# Patient Record
Sex: Male | Born: 1960 | Race: White | Hispanic: No | Marital: Married | Smoking: Never smoker
Health system: Southern US, Community
[De-identification: ages and names within clinical notes are randomized; demographics above are authoritative.]

## PROBLEM LIST (undated history)

## (undated) DIAGNOSIS — M1990 Unspecified osteoarthritis, unspecified site: Secondary | ICD-10-CM

## (undated) DIAGNOSIS — E785 Hyperlipidemia, unspecified: Secondary | ICD-10-CM

## (undated) DIAGNOSIS — M25819 Other specified joint disorders, unspecified shoulder: Secondary | ICD-10-CM

## (undated) DIAGNOSIS — M7540 Impingement syndrome of unspecified shoulder: Secondary | ICD-10-CM

## (undated) DIAGNOSIS — C801 Malignant (primary) neoplasm, unspecified: Secondary | ICD-10-CM

## (undated) HISTORY — PX: HERNIA REPAIR: SHX51

## (undated) HISTORY — PX: SKIN CANCER EXCISION: SHX779

## (undated) HISTORY — PX: SHOULDER SURGERY: SHX246

## (undated) HISTORY — PX: NOSE SURGERY: SHX723

## (undated) HISTORY — PX: KNEE ARTHROSCOPY: SHX127

## (undated) HISTORY — DX: Malignant (primary) neoplasm, unspecified: C80.1

---

## 1999-09-02 ENCOUNTER — Ambulatory Visit (HOSPITAL_COMMUNITY): Admission: RE | Admit: 1999-09-02 | Discharge: 1999-09-02 | Payer: Self-pay | Admitting: General Surgery

## 2002-07-28 ENCOUNTER — Ambulatory Visit (HOSPITAL_BASED_OUTPATIENT_CLINIC_OR_DEPARTMENT_OTHER): Admission: RE | Admit: 2002-07-28 | Discharge: 2002-07-28 | Payer: Self-pay | Admitting: Orthopedic Surgery

## 2006-07-12 ENCOUNTER — Emergency Department (HOSPITAL_COMMUNITY): Admission: EM | Admit: 2006-07-12 | Discharge: 2006-07-12 | Payer: Self-pay | Admitting: Emergency Medicine

## 2015-08-06 ENCOUNTER — Other Ambulatory Visit: Payer: Self-pay | Admitting: Physician Assistant

## 2015-08-06 NOTE — H&P (Signed)
Sotiris is a very old patient I haven't seen in a long time.  He comes in for evaluation of the right shoulder.  He has had a number of years, four or five, of increasing subacromial symptoms.  It has gotten to a point where he cannot throw.  He can no longer do pushups.  He has used anti-inflammatories and done rehab.  This has gotten the best of him and he would like to address this.  Same side where he had a softball injury in the 1980's and then arthroscopic decompression in the 1990's.  He did great after that and no issues until the last couple of years.  No specific trauma.   Remaining history and general exam is reviewed.   EXAMINATION: Specifically, healthy 55 year-old.  Height: 5?10.  Weight: 175 pounds.  Lungs clear to auscultation bilaterally.  Heart sounds normal.   I can get both shoulders through full motion.  There is no demonstrable cuff atrophy.  There is no instability.  On the right he has positive impingement.  Positive palms down abduction.  Marked cuff irritation.  Biceps intact.  On the left a little mild impingement, not marked.    X-RAYS: Three view x-rays of the right shoulder shows a Type I acromion.  Previous distal clavicle excision.  Subacromial space and glenohumeral joint look good.    IMPRESSION: Recurrent impingement, right shoulder.  I am very concerned this has progressed to a cuff tear.  It has been years and it is time to get to the bottom of this.  MRI to look at his structures.  He is going to follow up with me when that is complete.  Tailor further treatment depending on what that shows.  I don't think anything short of operative intervention is going to solve things, given that it has been present for 5-6 years.  He understands and agrees.    [name], M.D.  AddendumMagnum Fluck comes in for follow up.  I looked at his MRI.  Picture of longstanding impingement.  There is a very small articular surface partial tear supraspinatus.  Looking at the scan itself I  don't think this represents a significant functional full thickness tear.  He has had symptoms for years.  We discussed proceeding with definitive treatment.  Exam under anesthesia, arthroscopy, decompression.  He has already had a distal clavicle excision.  We will look at his cuff, but hopefully I am not going to have to do anything in regards to repair.  Full 25 minutes spent with him face-to-face covering all of this.  Also reviewed the scan and all of the photographs from the scan with him.  We are going to set this up in the near future.  I don't think anything can be done conservatively that is going to provide long-term improvement.  He understands and agrees.    [name], M.D.

## 2015-08-10 ENCOUNTER — Encounter (HOSPITAL_BASED_OUTPATIENT_CLINIC_OR_DEPARTMENT_OTHER): Payer: Self-pay | Admitting: *Deleted

## 2015-08-16 ENCOUNTER — Encounter (HOSPITAL_BASED_OUTPATIENT_CLINIC_OR_DEPARTMENT_OTHER): Payer: Self-pay | Admitting: *Deleted

## 2015-08-16 ENCOUNTER — Ambulatory Visit (HOSPITAL_BASED_OUTPATIENT_CLINIC_OR_DEPARTMENT_OTHER): Payer: 59 | Admitting: Anesthesiology

## 2015-08-16 ENCOUNTER — Ambulatory Visit (HOSPITAL_BASED_OUTPATIENT_CLINIC_OR_DEPARTMENT_OTHER)
Admission: RE | Admit: 2015-08-16 | Discharge: 2015-08-16 | Disposition: A | Discharge status: Home / Self Care | Payer: 59 | Source: Ambulatory Visit | Attending: Orthopedic Surgery | Admitting: Orthopedic Surgery

## 2015-08-16 ENCOUNTER — Encounter (HOSPITAL_BASED_OUTPATIENT_CLINIC_OR_DEPARTMENT_OTHER)
Admission: RE | Disposition: A | Discharge status: Home / Self Care | Payer: Self-pay | Source: Ambulatory Visit | Attending: Orthopedic Surgery

## 2015-08-16 DIAGNOSIS — M75101 Unspecified rotator cuff tear or rupture of right shoulder, not specified as traumatic: Secondary | ICD-10-CM | POA: Insufficient documentation

## 2015-08-16 DIAGNOSIS — S46111A Strain of muscle, fascia and tendon of long head of biceps, right arm, initial encounter: Secondary | ICD-10-CM | POA: Insufficient documentation

## 2015-08-16 DIAGNOSIS — M94211 Chondromalacia, right shoulder: Secondary | ICD-10-CM | POA: Diagnosis not present

## 2015-08-16 DIAGNOSIS — X58XXXA Exposure to other specified factors, initial encounter: Secondary | ICD-10-CM | POA: Insufficient documentation

## 2015-08-16 DIAGNOSIS — M7541 Impingement syndrome of right shoulder: Secondary | ICD-10-CM | POA: Insufficient documentation

## 2015-08-16 HISTORY — DX: Hyperlipidemia, unspecified: E78.5

## 2015-08-16 HISTORY — DX: Unspecified osteoarthritis, unspecified site: M19.90

## 2015-08-16 HISTORY — DX: Impingement syndrome of unspecified shoulder: M75.40

## 2015-08-16 HISTORY — DX: Other specified joint disorders, unspecified shoulder: M25.819

## 2015-08-16 HISTORY — PX: SHOULDER ARTHROSCOPY WITH ROTATOR CUFF REPAIR: SHX5685

## 2015-08-16 HISTORY — PX: SHOULDER ARTHROSCOPY WITH SUBACROMIAL DECOMPRESSION: SHX5684

## 2015-08-16 SURGERY — ARTHROSCOPY, SHOULDER, WITH ROTATOR CUFF REPAIR
Anesthesia: General | Site: Shoulder | Laterality: Right

## 2015-08-16 MED ORDER — DEXAMETHASONE SODIUM PHOSPHATE 4 MG/ML IJ SOLN
INTRAMUSCULAR | Status: DC | PRN
  Administered 2015-08-16: 10 mg via INTRAVENOUS

## 2015-08-16 MED ORDER — OXYCODONE HCL 5 MG PO TABS: 5.0000 mg | ORAL_TABLET | Freq: Once | ORAL | Status: DC | PRN

## 2015-08-16 MED ORDER — OXYCODONE HCL 5 MG/5ML PO SOLN: 5.0000 mg | Freq: Once | ORAL | Status: DC | PRN

## 2015-08-16 MED ORDER — MEPERIDINE HCL 25 MG/ML IJ SOLN: 6.2500 mg | INTRAMUSCULAR | Status: DC | PRN

## 2015-08-16 MED ORDER — FENTANYL CITRATE (PF) 100 MCG/2ML IJ SOLN
INTRAMUSCULAR | Status: AC
  Filled 2015-08-16: qty 2

## 2015-08-16 MED ORDER — LIDOCAINE HCL (CARDIAC) 20 MG/ML IV SOLN
INTRAVENOUS | Status: DC | PRN
  Administered 2015-08-16: 60 mg via INTRAVENOUS

## 2015-08-16 MED ORDER — DEXAMETHASONE SODIUM PHOSPHATE 10 MG/ML IJ SOLN
INTRAMUSCULAR | Status: AC
  Filled 2015-08-16: qty 1

## 2015-08-16 MED ORDER — ONDANSETRON HCL 4 MG PO TABS: 4.0000 mg | ORAL_TABLET | Freq: Three times a day (TID) | ORAL | Status: DC | PRN

## 2015-08-16 MED ORDER — BUPIVACAINE-EPINEPHRINE (PF) 0.5% -1:200000 IJ SOLN
INTRAMUSCULAR | Status: DC | PRN
  Administered 2015-08-16: 25 mL via PERINEURAL

## 2015-08-16 MED ORDER — ONDANSETRON HCL 4 MG/2ML IJ SOLN
INTRAMUSCULAR | Status: DC | PRN
Start: 2015-08-16 — End: 2015-08-16
  Administered 2015-08-16: 4 mg via INTRAVENOUS

## 2015-08-16 MED ORDER — CHLORHEXIDINE GLUCONATE 4 % EX LIQD: 60.0000 mL | Freq: Once | CUTANEOUS | Status: DC

## 2015-08-16 MED ORDER — SCOPOLAMINE 1 MG/3DAYS TD PT72: 1.0000 | MEDICATED_PATCH | Freq: Once | TRANSDERMAL | Status: DC | PRN

## 2015-08-16 MED ORDER — SODIUM CHLORIDE 0.9 % IR SOLN
Status: DC | PRN
  Administered 2015-08-16: 4000 mL

## 2015-08-16 MED ORDER — GLYCOPYRROLATE 0.2 MG/ML IJ SOLN: 0.2000 mg | Freq: Once | INTRAMUSCULAR | Status: DC | PRN

## 2015-08-16 MED ORDER — OXYCODONE-ACETAMINOPHEN 5-325 MG PO TABS: 1.0000 | ORAL_TABLET | ORAL | Status: DC | PRN

## 2015-08-16 MED ORDER — ONDANSETRON HCL 4 MG/2ML IJ SOLN
INTRAMUSCULAR | Status: AC
  Filled 2015-08-16: qty 2

## 2015-08-16 MED ORDER — LACTATED RINGERS IV SOLN: INTRAVENOUS | Status: DC

## 2015-08-16 MED ORDER — PROPOFOL 10 MG/ML IV BOLUS
INTRAVENOUS | Status: DC | PRN
  Administered 2015-08-16: 200 mg via INTRAVENOUS

## 2015-08-16 MED ORDER — SUCCINYLCHOLINE CHLORIDE 20 MG/ML IJ SOLN
INTRAMUSCULAR | Status: DC | PRN
  Administered 2015-08-16: 100 mg via INTRAVENOUS

## 2015-08-16 MED ORDER — CEFAZOLIN SODIUM-DEXTROSE 2-3 GM-% IV SOLR
2.0000 g | INTRAVENOUS | Status: AC
  Administered 2015-08-16: 2 g via INTRAVENOUS

## 2015-08-16 MED ORDER — LIDOCAINE HCL (CARDIAC) 20 MG/ML IV SOLN
INTRAVENOUS | Status: AC
  Filled 2015-08-16: qty 5

## 2015-08-16 MED ORDER — MIDAZOLAM HCL 2 MG/2ML IJ SOLN
INTRAMUSCULAR | Status: AC
  Filled 2015-08-16: qty 2

## 2015-08-16 MED ORDER — HYDROMORPHONE HCL 1 MG/ML IJ SOLN: 0.2500 mg | INTRAMUSCULAR | Status: DC | PRN

## 2015-08-16 MED ORDER — FENTANYL CITRATE (PF) 100 MCG/2ML IJ SOLN
50.0000 ug | INTRAMUSCULAR | Status: DC | PRN
  Administered 2015-08-16: 50 ug via INTRAVENOUS
  Administered 2015-08-16: 100 ug via INTRAVENOUS

## 2015-08-16 MED ORDER — MIDAZOLAM HCL 2 MG/2ML IJ SOLN
1.0000 mg | INTRAMUSCULAR | Status: DC | PRN
  Administered 2015-08-16: 2 mg via INTRAVENOUS

## 2015-08-16 MED ORDER — LACTATED RINGERS IV SOLN
INTRAVENOUS | Status: DC
  Administered 2015-08-16: 10:00:00 via INTRAVENOUS

## 2015-08-16 SURGICAL SUPPLY — 71 items
ANCH SUT SWLK 19.1X4.75 (Anchor) ×4 IMPLANT
ANCHOR SUT BIO SW 4.75X19.1 (Anchor) ×4 IMPLANT
APL SKNCLS STERI-STRIP NONHPOA (GAUZE/BANDAGES/DRESSINGS)
BENZOIN TINCTURE PRP APPL 2/3 (GAUZE/BANDAGES/DRESSINGS) IMPLANT
BLADE CUTTER GATOR 3.5 (BLADE) ×3 IMPLANT
BLADE CUTTER MENIS 5.5 (BLADE) IMPLANT
BLADE GREAT WHITE 4.2 (BLADE) ×3 IMPLANT
BLADE SURG 15 STRL LF DISP TIS (BLADE) IMPLANT
BLADE SURG 15 STRL SS (BLADE)
BUR OVAL 6.0 (BURR) ×3 IMPLANT
CANNULA DRY DOC 8X75 (CANNULA) ×2 IMPLANT
CANNULA TWIST IN 8.25X7CM (CANNULA) IMPLANT
DECANTER SPIKE VIAL GLASS SM (MISCELLANEOUS) IMPLANT
DRAPE STERI 35X30 U-POUCH (DRAPES) ×3 IMPLANT
DRAPE U-SHAPE 47X51 STRL (DRAPES) ×3 IMPLANT
DRAPE U-SHAPE 76X120 STRL (DRAPES) ×6 IMPLANT
DRSG PAD ABDOMINAL 8X10 ST (GAUZE/BANDAGES/DRESSINGS) ×3 IMPLANT
DURAPREP 26ML APPLICATOR (WOUND CARE) ×3 IMPLANT
ELECT MENISCUS 165MM 90D (ELECTRODE) ×3 IMPLANT
ELECT REM PT RETURN 9FT ADLT (ELECTROSURGICAL) ×3
ELECTRODE REM PT RTRN 9FT ADLT (ELECTROSURGICAL) ×2 IMPLANT
GAUZE SPONGE 4X4 12PLY STRL (GAUZE/BANDAGES/DRESSINGS) ×6 IMPLANT
GAUZE XEROFORM 1X8 LF (GAUZE/BANDAGES/DRESSINGS) ×3 IMPLANT
GLOVE BIOGEL PI IND STRL 7.0 (GLOVE) ×2 IMPLANT
GLOVE BIOGEL PI INDICATOR 7.0 (GLOVE) ×1
GLOVE ECLIPSE 7.0 STRL STRAW (GLOVE) ×5 IMPLANT
GLOVE SURG ORTHO 8.0 STRL STRW (GLOVE) ×3 IMPLANT
GOWN STRL REUS W/ TWL LRG LVL3 (GOWN DISPOSABLE) ×4 IMPLANT
GOWN STRL REUS W/ TWL XL LVL3 (GOWN DISPOSABLE) ×2 IMPLANT
GOWN STRL REUS W/TWL LRG LVL3 (GOWN DISPOSABLE) ×6
GOWN STRL REUS W/TWL XL LVL3 (GOWN DISPOSABLE) ×3
IV NS IRRIG 3000ML ARTHROMATIC (IV SOLUTION) ×12 IMPLANT
MANIFOLD NEPTUNE II (INSTRUMENTS) ×3 IMPLANT
NDL SCORPION MULTI FIRE (NEEDLE) IMPLANT
NDL SUT 6 .5 CRC .975X.05 MAYO (NEEDLE) IMPLANT
NEEDLE MAYO TAPER (NEEDLE)
NEEDLE SCORPION MULTI FIRE (NEEDLE) ×3 IMPLANT
NS IRRIG 1000ML POUR BTL (IV SOLUTION) IMPLANT
PACK ARTHROSCOPY DSU (CUSTOM PROCEDURE TRAY) ×3 IMPLANT
PACK BASIN DAY SURGERY FS (CUSTOM PROCEDURE TRAY) ×3 IMPLANT
PASSER SUT SWANSON 36MM LOOP (INSTRUMENTS) IMPLANT
PENCIL BUTTON HOLSTER BLD 10FT (ELECTRODE) ×3 IMPLANT
SET ARTHROSCOPY TUBING (MISCELLANEOUS) ×3
SET ARTHROSCOPY TUBING LN (MISCELLANEOUS) ×2 IMPLANT
SLEEVE SCD COMPRESS KNEE MED (MISCELLANEOUS) ×2 IMPLANT
SLING ARM FOAM STRAP LRG (SOFTGOODS) IMPLANT
SLING ARM IMMOBILIZER LRG (SOFTGOODS) ×2 IMPLANT
SLING ARM IMMOBILIZER MED (SOFTGOODS) IMPLANT
SLING ARM MED ADULT FOAM STRAP (SOFTGOODS) IMPLANT
SLING ARM XL FOAM STRAP (SOFTGOODS) IMPLANT
SPONGE LAP 4X18 X RAY DECT (DISPOSABLE) IMPLANT
STRIP CLOSURE SKIN 1/2X4 (GAUZE/BANDAGES/DRESSINGS) IMPLANT
SUCTION FRAZIER HANDLE 10FR (MISCELLANEOUS)
SUCTION TUBE FRAZIER 10FR DISP (MISCELLANEOUS) IMPLANT
SUT ETHIBOND 2 OS 4 DA (SUTURE) IMPLANT
SUT ETHILON 2 0 FS 18 (SUTURE) IMPLANT
SUT ETHILON 3 0 PS 1 (SUTURE) IMPLANT
SUT FIBERWIRE #2 38 T-5 BLUE (SUTURE)
SUT RETRIEVER MED (INSTRUMENTS) IMPLANT
SUT TIGER TAPE 7 IN WHITE (SUTURE) IMPLANT
SUT VIC AB 0 CT1 27 (SUTURE)
SUT VIC AB 0 CT1 27XBRD ANBCTR (SUTURE) IMPLANT
SUT VIC AB 2-0 SH 27 (SUTURE)
SUT VIC AB 2-0 SH 27XBRD (SUTURE) IMPLANT
SUT VIC AB 3-0 FS2 27 (SUTURE) IMPLANT
SUTURE FIBERWR #2 38 T-5 BLUE (SUTURE) IMPLANT
TAPE FIBER 2MM 7IN #2 BLUE (SUTURE) IMPLANT
TOWEL OR 17X24 6PK STRL BLUE (TOWEL DISPOSABLE) ×3 IMPLANT
TOWEL OR NON WOVEN STRL DISP B (DISPOSABLE) ×6 IMPLANT
WATER STERILE IRR 1000ML POUR (IV SOLUTION) ×3 IMPLANT
YANKAUER SUCT BULB TIP NO VENT (SUCTIONS) IMPLANT

## 2015-08-16 NOTE — Transfer of Care (Signed)
Immediate Anesthesia Transfer of Care Note  Patient: John [name]  Procedure(s) Performed: Procedure(s): SHOULDER ARTHROSCOPY WITH ROTATOR CUFF REPAIR (Right) SHOULDER ARTHROSCOPY WITH SUBACROMIAL DECOMPRESSION LABRAL DEBRIDEMENT (Right)  Patient Location: PACU  Anesthesia Type:General  Level of Consciousness: awake and sedated  Airway & Oxygen Therapy: Patient Spontanous Breathing and Patient connected to face mask oxygen  Post-op Assessment: Report given to RN and Post -op Vital signs reviewed and stable  Post vital signs: Reviewed and stable  Last Vitals:  Filed Vitals:   08/16/15 1023 08/16/15 1024  BP:    Pulse: 73 71  Temp:    Resp: 23 17    Complications: No apparent anesthesia complications

## 2015-08-16 NOTE — Anesthesia Postprocedure Evaluation (Signed)
Anesthesia Post Note  Patient: John [name]  Procedure(s) Performed: Procedure(s) (LRB): SHOULDER ARTHROSCOPY WITH ROTATOR CUFF REPAIR (Right) SHOULDER ARTHROSCOPY WITH SUBACROMIAL DECOMPRESSION LABRAL DEBRIDEMENT (Right)  Patient location during evaluation: PACU Anesthesia Type: General Level of consciousness: awake and alert Pain management: pain level controlled Vital Signs Assessment: post-procedure vital signs reviewed and stable Respiratory status: spontaneous breathing, nonlabored ventilation and respiratory function stable Cardiovascular status: blood pressure returned to baseline and stable Postop Assessment: no signs of nausea or vomiting Anesthetic complications: no    Last Vitals:  Filed Vitals:   08/16/15 1315 08/16/15 1350  BP: 113/77 114/78  Pulse: 64 62  Temp: 36.5 C   Resp: 16 14    Last Pain:  Filed Vitals:   08/16/15 1355  PainSc: 0-No pain                 [name] A

## 2015-08-16 NOTE — Progress Notes (Signed)
Assisted Dr. Crews with right, ultrasound guided, interscalene  block. Side rails up, monitors on throughout procedure. See vital signs in flow sheet. Tolerated Procedure well. 

## 2015-08-16 NOTE — Anesthesia Procedure Notes (Addendum)
Anesthesia Regional Block:  Interscalene brachial plexus block  Pre-Anesthetic Checklist: ,, timeout performed, Correct Patient, Correct Site, Correct Laterality, Correct Procedure, Correct Position, site marked, Risks and benefits discussed,  Surgical consent,  Pre-op evaluation,  At surgeon's request and post-op pain management  Laterality: Right and Upper  Prep: chloraprep       Needles:  Injection technique: Single-shot  Needle Type: Echogenic Needle     Needle Length: 5cm 5 cm Needle Gauge: 21 and 21 G    Additional Needles:  Procedures: ultrasound guided (picture in chart) Interscalene brachial plexus block Narrative:  Start time: 08/16/2015 10:10 AM End time: 08/16/2015 10:15 AM Injection made incrementally with aspirations every 5 mL.  Performed by: Personally  Anesthesiologist: CREWS, DAVID   Procedure Name: Intubation Performed by: Terrance Mass Pre-anesthesia Checklist: Patient identified, Emergency Drugs available, Suction available and Patient being monitored Patient Re-evaluated:Patient Re-evaluated prior to inductionOxygen Delivery Method: Circle System Utilized Preoxygenation: Pre-oxygenation with 100% oxygen Intubation Type: IV induction Ventilation: Mask ventilation without difficulty Tube type: Oral Tube size: 7.0 mm Number of attempts: 1 Airway Equipment and Method: Stylet and Oral airway Placement Confirmation: ETT inserted through vocal cords under direct vision,  positive ETCO2 and breath sounds checked- equal and bilateral Secured at: 23 cm Tube secured with: Tape Dental Injury: Teeth and Oropharynx as per pre-operative assessment       Right ISB image

## 2015-08-16 NOTE — Interval H&P Note (Signed)
History and Physical Interval Note:  08/16/2015 7:43 AM  John [name]  has presented today for surgery, with the diagnosis of RIGHT SHOULDER OA IMPINGEMENT SYNROME ,STRAIN OF MUSCLES AND TENDONS OF THE ROTATOR CUFF  The various methods of treatment have been discussed with the patient and family. After consideration of risks, benefits and other options for treatment, the patient has consented to  Procedure(s): [place] (Right) as a surgical intervention .  The patient's history has been reviewed, patient examined, no change in status, stable for surgery.  I have reviewed the patient's chart and labs.  Questions were answered to the patient's satisfaction.     [name]

## 2015-08-16 NOTE — Anesthesia Preprocedure Evaluation (Signed)
Anesthesia Evaluation  Patient identified by MRN, date of birth, ID band Patient awake    Reviewed: Allergy & Precautions, NPO status , Patient's Chart, lab work & pertinent test results  Airway Mallampati: I  TM Distance: >3 FB Neck ROM: Full    Dental  (+) Teeth Intact, Dental Advisory Given   Pulmonary    breath sounds clear to auscultation       Cardiovascular  Rhythm:Regular Rate:Normal     Neuro/Psych    GI/Hepatic   Endo/Other    Renal/GU      Musculoskeletal   Abdominal   Peds  Hematology   Anesthesia Other Findings   Reproductive/Obstetrics                             Anesthesia Physical Anesthesia Plan  ASA: I  Anesthesia Plan: General   Post-op Pain Management: MAC Combined w/ Regional for Post-op pain   Induction: Intravenous  Airway Management Planned: Oral ETT  Additional Equipment:   Intra-op Plan:   Post-operative Plan: Extubation in OR  Informed Consent: I have reviewed the patients History and Physical, chart, labs and discussed the procedure including the risks, benefits and alternatives for the proposed anesthesia with the patient or authorized representative who has indicated his/her understanding and acceptance.   Dental advisory given  Plan Discussed with: CRNA, Anesthesiologist and Surgeon  Anesthesia Plan Comments:         Anesthesia Quick Evaluation

## 2015-08-16 NOTE — Discharge Instructions (Signed)
Shouder arthroscopy, rotator cuff repair, subacromial decompression °Care After Instructions °Refer to this sheet in the next few weeks. These discharge instructions provide you with general information on caring for yourself after you leave the hospital. Your caregiver may also give you specific instructions. Your treatment has been planned according to the most current medical practices available, but unavoidable complications sometimes occur. If you have any problems or questions after discharge, please call your caregiver. °HOME INSTRUCTIONS °You may resume a normal diet and activities as directed.  °Take showers instead of baths until informed otherwise.  °Change bandages (dressings) in 3 days.  Swab wounds daily with betadine.  Wash shoulder with soap and water.  Pat dry.  Cover wounds with bandaids. °Only take over-the-counter or prescription medicines for pain, discomfort, or fever as directed by your caregiver.  °Wear your sling for the next 6 weeks unless otherwise instructed. °Eat a well-balanced diet.  °Avoid lifting or driving until you are instructed otherwise.  °Make an appointment to see your caregiver for stitches (suture) or staple removal one week after surgery.  ° °SEEK MEDICAL CARE IF: °You have swelling of your calf or leg.  °You develop shortness of breath or chest pain.  °You have redness, swelling, or increasing pain in the wound.  °There is pus or any unusual drainage coming from the surgical site.  °You notice a bad smell coming from the surgical site or dressing.  °The surgical site breaks open after sutures or staples have been removed.  °There is persistent bleeding from the suture or staple line.  °You are getting worse or are not improving.  °You have any other questions or concerns.  °SEEK IMMEDIATE MEDICAL CARE IF:  °You have a fever greater than 101 °You develop a rash.  °You have difficulty breathing.  °You develop any reaction or side effects to medicines given.  °Your knee  motion is decreasing rather than improving.  °MAKE SURE YOU:  °Understand these instructions.  °Will watch your condition.  °Will get help right away if you are not doing well or get worse.  ° ° ° °Regional Anesthesia Blocks ° °1. Numbness or the inability to move the "blocked" extremity may last from 3-48 hours after placement. The length of time depends on the medication injected and your individual response to the medication. If the numbness is not going away after 48 hours, call your surgeon. ° °2. The extremity that is blocked will need to be protected until the numbness is gone and the  Strength has returned. Because you cannot feel it, you will need to take extra care to avoid injury. Because it may be weak, you may have difficulty moving it or using it. You may not know what position it is in without looking at it while the block is in effect. ° °3. For blocks in the legs and feet, returning to weight bearing and walking needs to be done carefully. You will need to wait until the numbness is entirely gone and the strength has returned. You should be able to move your leg and foot normally before you try and bear weight or walk. You will need someone to be with you when you first try to ensure you do not fall and possibly risk injury. ° °4. Bruising and tenderness at the needle site are common side effects and will resolve in a few days. ° °5. Persistent numbness or new problems with movement should be communicated to the surgeon or the [facility] (  [phone])/ [facility] ([phone]). ° ° ° ° ° ° °Post Anesthesia Home Care Instructions ° °Activity: °Get plenty of rest for the remainder of the day. A responsible adult should stay with you for 24 hours following the procedure.  °For the next 24 hours, DO NOT: °-Drive a car °-Operate machinery °-Drink alcoholic beverages °-Take any medication unless instructed by your physician °-Make any legal decisions or sign important  papers. ° °Meals: °Start with liquid foods such as gelatin or soup. Progress to regular foods as tolerated. Avoid greasy, spicy, heavy foods. If nausea and/or vomiting occur, drink only clear liquids until the nausea and/or vomiting subsides. Call your physician if vomiting continues. ° °Special Instructions/Symptoms: °Your throat may feel dry or sore from the anesthesia or the breathing tube placed in your throat during surgery. If this causes discomfort, gargle with warm salt water. The discomfort should disappear within 24 hours. ° °If you had a scopolamine patch placed behind your ear for the management of post- operative nausea and/or vomiting: ° °1. The medication in the patch is effective for 72 hours, after which it should be removed.  Wrap patch in a tissue and discard in the trash. Wash hands thoroughly with soap and water. °2. You may remove the patch earlier than 72 hours if you experience unpleasant side effects which may include dry mouth, dizziness or visual disturbances. °3. Avoid touching the patch. Wash your hands with soap and water after contact with the patch. °  ° °

## 2015-08-17 ENCOUNTER — Encounter (HOSPITAL_BASED_OUTPATIENT_CLINIC_OR_DEPARTMENT_OTHER): Payer: Self-pay | Admitting: Orthopedic Surgery

## 2015-08-17 NOTE — Op Note (Signed)
NAMEELLIOTTE, VANCE               ACCOUNT NO.:  [id number]  MEDICAL RECORD NO.:  [id number]  LOCATION:                                 FACILITY:  PHYSICIAN:  [name], M.D.      DATE OF BIRTH:  DATE OF PROCEDURE:  08/16/2015 DATE OF DISCHARGE:                              OPERATIVE REPORT   PREOPERATIVE DIAGNOSES:  Right shoulder chronic impingement.  Remote previous operative intervention distal clavicle excision.  Partial tearing, rotator cuff.  POSTOPERATIVE DIAGNOSES:  Extensive full-thickness tear, supraspinatus tendon and rotator cuff, very reparable.  Focal grade III chondromalacia humeral head.  Circumferential degenerative tearing, labrum.  More than 50% tearing, long head biceps tendon, intra-articular portion. Impingement with subacromial spurs.  Adequate previous distal clavicle excision.  PROCEDURE:  Right shoulder exam under anesthesia, arthroscopy.  I released resection intra-articular portion of biceps allowing it to recess in the bicipital groove for auto tenodesis.  Debridement of labrum.  Debridement, mobilization rotator cuff above and below. Bursectomy acromioplasty CA ligament release.  Rotator cuff repair with FiberWire suture x2 and SwiveLock anchors x2.  SURGEON:  [name], M.D.  ASSISTANT:  Elmyra Ricks, PA, present throughout the entire case and necessary for timely completion of procedure.  ANESTHESIA:  General.  BLOOD LOSS:  Minimal.  SPECIMENS:  None.  COUNTS:  None.  COMPLICATIONS:  None.  DRESSINGS:  Soft compressive shoulder immobilizer.  DESCRIPTION OF PROCEDURE:  The patient was brought to the operating room, placed on the operating table in supine position.  After adequate anesthesia had been obtained, placed in a beach-chair position.  Prepped and draped in usual sterile fashion.  Full motion stable shoulder. Three portals anterior, posterior, and lateral.  Arthroscope introduced, shoulder distended and  inspected.  Focal grade III chondral lesion middle of the humeral head 1.5 cm chondroplasty to a stable surface. Loose bodies removed.  Circumferential degenerative tearing labrum debrided.  Biceps was more than 50% torn throughout the intra-articular portion and exiting the shoulder.  This intra-articular portion release resected allowing the biceps to recess the bicipital groove.  Full- thickness tear supraspinatus with marked intertendinous tearing throughout the entire crescent and anterior cable.  Debrided mobilized above below.  Cannula redirected subacromially.  Some adhesions from the side were resected.  Acromioplasty and type 2 to a type 1 acromion releasing CA ligament.  Adequacy of distal clavicle excision confirmed. Lateral portal was opened with a cannula.  The mobilized cuff was captured with 2 horizontal mattress sutures, utilizing a Scorpion device.  This was then fixed down in the tuberosity, which was debrided. Two SwiveLock anchors.  At completion, a nice firm watertight closure cup.  Adequacy of decompression confirmed viewing from all portals. Instruments were removed.  Portals were closed with nylon.  Sterile compressive dressing applied.  Shoulder immobilizer applied.  Anesthesia reversed.  Brought to the recovery room.  Tolerated the surgery well. No complications.     [name], M.D.     DFM/MEDQ  D:  08/16/2015  T:  08/16/2015  Job:  [id number]

## 2015-09-26 DIAGNOSIS — J343 Hypertrophy of nasal turbinates: Secondary | ICD-10-CM | POA: Insufficient documentation

## 2015-09-26 DIAGNOSIS — J342 Deviated nasal septum: Secondary | ICD-10-CM | POA: Insufficient documentation

## 2015-09-26 DIAGNOSIS — J302 Other seasonal allergic rhinitis: Secondary | ICD-10-CM | POA: Insufficient documentation

## 2018-07-27 ENCOUNTER — Encounter: Payer: Self-pay | Admitting: Neurology

## 2018-07-27 ENCOUNTER — Ambulatory Visit: Payer: 59 | Admitting: Neurology

## 2018-07-27 VITALS — BP 128/81 | HR 60 | Ht 70.0 in | Wt 188.0 lb

## 2018-07-27 DIAGNOSIS — G250 Essential tremor: Secondary | ICD-10-CM

## 2018-07-27 DIAGNOSIS — R419 Unspecified symptoms and signs involving cognitive functions and awareness: Secondary | ICD-10-CM | POA: Diagnosis not present

## 2018-07-27 MED ORDER — PROPRANOLOL HCL 20 MG PO TABS: ORAL_TABLET | ORAL | 5 refills | Status: DC

## 2018-07-27 NOTE — Progress Notes (Signed)
Subjective:    Patient ID: John [name] is a 58 y.o. male.  HPI     Star Age, MD, PhD [facility] [address], [address] [place], [place] [address]  Dear Dr. Olen Pel,   I saw your patient, John [name], upon your kind request in my neurologic clinic today for initial consultation of his tremors. The patient is unaccompanied today. As you know, John [name] is a 58 year old right-handed gentleman with an underlying medical history of hyperlipidemia, allergic rhinitis, bilateral shoulder surgeries, and overweight state, who reports a bilateral hand tremor for the past few years, about 5 years, affecting both hands, perhaps the right more than left but he is right-handed. He is not impaired in his day-to-day activities but finds the tremor socially embarrassing. It has progressed with time. Tremor is typically with certain activity or with holding something such as a cup or a paper. No resting tremor, no lower extremity tremor, no recent falls, no balance issues.  There is no obvious family history of tremors, no family history of Parkinson's disease. He does have a family history of dementia affecting his father and he is worried about memory loss. He has been more forgetful. He is family especially his wife has not voiced any concern about his memory function he feels that he may have a problem. I reviewed your office note from 06/16/2018, which you kindly included. He quit smoking in 2001, and drinks alcohol in the form of beer or a mixed drink, 2-3 per night on average. He drinks caffeine, 2 servings per day on average. He is married and lives with his wife, he works from home, they have 3 children. He does not always hydrate very well he admits.   His Past Medical History Is Significant For: Past Medical History:  Diagnosis Date  . Arthritis    OA right shoulder  . Cancer (HCC)    BCC of right forearm  . Hyperlipidemia   . Shoulder impingement    right    His Past Surgical History Is Significant For: Past Surgical History:  Procedure Laterality Date  . HERNIA REPAIR Right    IHR  . KNEE ARTHROSCOPY Left   . NOSE SURGERY    . SHOULDER ARTHROSCOPY WITH ROTATOR CUFF REPAIR Right 08/16/2015   Procedure: SHOULDER ARTHROSCOPY WITH ROTATOR CUFF REPAIR;  Surgeon: [name], MD;  Location: [facility];  Service: Orthopedics;  Laterality: Right;  . SHOULDER ARTHROSCOPY WITH SUBACROMIAL DECOMPRESSION Right 08/16/2015   Procedure: SHOULDER ARTHROSCOPY WITH SUBACROMIAL DECOMPRESSION LABRAL DEBRIDEMENT;  Surgeon: [name], MD;  Location: [facility];  Service: Orthopedics;  Laterality: Right;  . SHOULDER SURGERY Right   . SKIN CANCER EXCISION      His Family History Is Significant For: No family history on file.  His Social History Is Significant For: Social History   Socioeconomic History  . Marital status: Married    Spouse name: Not on file  . Number of children: Not on file  . Years of education: Not on file  . Highest education level: Not on file  Occupational History  . Not on file  Social Needs  . Financial resource strain: Not on file  . Food insecurity:    Worry: Not on file    Inability: Not on file  . Transportation needs:    Medical: Not on file    Non-medical: Not on file  Tobacco Use  . Smoking status: Never Smoker  .  Smokeless tobacco: Never Used  Substance and Sexual Activity  . Alcohol use: Yes    Comment: drinks 1-2 drinks/night  . Drug use: No  . Sexual activity: Not on file  Lifestyle  . Physical activity:    Days per week: Not on file    Minutes per session: Not on file  . Stress: Not on file  Relationships  . Social connections:    Talks on phone: Not on file    Gets together: Not on file    Attends religious service: Not on file    Active member of club or organization: Not on file    Attends meetings of clubs or organizations: Not on file     Relationship status: Not on file  Other Topics Concern  . Not on file  Social History Narrative  . Not on file    His Allergies Are:  No Known Allergies:   His Current Medications Are:  Outpatient Encounter Medications as of 07/27/2018  Medication Sig  . aspirin EC 81 MG tablet Take 81 mg by mouth daily.  . Coenzyme Q10-Fish Oil-Vit E (CO-Q 10 OMEGA-3 FISH OIL) CAPS Take by mouth.  . Omega-3 Fatty Acids (FISH OIL) 1200 MG CAPS Take 1,200 mg by mouth daily.  . Red Yeast Rice 600 MG TABS Take by mouth.  . [DISCONTINUED] ondansetron (ZOFRAN) 4 MG tablet Take 1 tablet (4 mg total) by mouth every 8 (eight) hours as needed for nausea or vomiting.  . [DISCONTINUED] oxyCODONE-acetaminophen (ROXICET) 5-325 MG tablet Take 1-2 tablets by mouth every 4 (four) hours as needed.   No facility-administered encounter medications on file as of 07/27/2018.   :   Review of Systems:  Out of a complete 14 point review of systems, all are reviewed and negative with the exception of these symptoms as listed below:  Review of Systems  Neurological:       Pt presents today to discuss his bilateral hand tremors. Pt has noticed the tremors for a couple years now and they seem to be getting worse. Pt is right handed.     Objective:  Neurological Exam  Physical Exam Physical Examination:   Vitals:   07/27/18 1014  BP: 128/81  Pulse: 60   General Examination: The patient is a very pleasant 58 y.o. male in no acute distress. He appears well-developed and well-nourished and well groomed.   HEENT: Normocephalic, atraumatic, pupils are equal, round and reactive to light and accommodation. Extraocular tracking is good without limitation to gaze excursion or nystagmus noted. Normal smooth pursuit is noted. Hearing is grossly intact. Face is symmetric with normal facial animation and normal facial sensation. Speech is clear with no dysarthria noted. There is no hypophonia. There is no lip, neck/head, jaw or voice  tremor. Neck is supple with full range of passive and active motion. There are no carotid bruits on auscultation. Oropharynx exam reveals: moderate mouth dryness, adequate dental hygiene. Tongue protrudes centrally and palate elevates symmetrically.  Chest: Clear to auscultation without wheezing, rhonchi or crackles noted.  Heart: S1+S2+0, regular and normal without murmurs, rubs or gallops noted.   Abdomen: Soft, non-tender and non-distended with normal bowel sounds appreciated on auscultation.  Extremities: There is no pitting edema in the distal lower extremities bilaterally. Pedal pulses are intact.  Skin: Warm and dry without trophic changes noted.  Musculoskeletal: exam reveals no obvious joint deformities, tenderness or joint swelling or erythema.   Neurologically:  Mental status: The patient is awake, alert and oriented  in all 4 spheres. His immediate and remote memory, attention, language skills and fund of knowledge are appropriate. There is no evidence of aphasia, agnosia, apraxia or anomia. Speech is clear with normal prosody and enunciation. Thought process is linear. Mood is normal and affect is normal.  Cranial nerves II - XII are as described above under HEENT exam. In addition: shoulder shrug is normal with equal shoulder height noted. Motor exam: Normal bulk, strength and tone is noted. There is no drift, resting tremor or rebound.  On 07/27/2018: on Archimedes spiral drawing he has minimal trembling with the left hand, no significant trembling noted with his right hand. Handwriting with his right hand is legible, slightly on the smaller side, not particularly tremulous.  He has a slight bilateral upper extremity postural tremor, mild action tremor bilaterally, postural tremors or little more noticeable on the left, no intention tremor, no resting tremor.  Romberg is negative. Reflexes are 2+ throughout. Fine motor skills and coordination: intact with normal finger taps, normal  hand movements, normal rapid alternating patting, normal foot taps and normal foot agility.  Cerebellar testing: No dysmetria or intention tremor on finger to nose testing. Heel to shin is unremarkable bilaterally. There is no truncal or gait ataxia.  Sensory exam: intact to light touch.  Gait, station and balance: He stands easily. No veering to one side is noted. No leaning to one side is noted. Posture is age-appropriate and stance is narrow based. Gait shows normal stride length and normal pace. No problems turning are noted. Tandem walk is unremarkable.   Assessment and Plan:   In summary, John [name] is a very pleasant 58 y.o.-year old male with an underlying medical history of hyperlipidemia, allergic rhinitis, bilateral shoulder surgeries, and overweight state, who presents for Evaluation of his hand tremors of about 5 years duration. 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 He has no signs of parkinsonism and is reassured in that regard. He has cognitive complaints and we will address this next time with more formal evaluation, he reports a family history of dementia affecting his father. For symptomatically treatment of his tremor he is advised to use propranolol as needed a low-dose. We talked about expectations, side effects and limitations of this medication. He is advised of the possibility of having a lower heart rate and low blood pressure on it. I provided him with a new prescription and written instructions. I suggested a recheck in 3 months, sooner if needed, we can do a formal memory evaluation at the time. He is furthermore advised to increase his water intake and limit his alcohol consumption to one serving per day and not to combine his beta blocker when he drinks alcohol. I answered all his questions today and he was in  agreement. Thank you very much for allowing me to participate in the care of this nice patient. If I can be of any further assistance to you please do not hesitate to call me at [phone].  Sincerely,   Star Age, MD, PhD

## 2018-07-27 NOTE — Patient Instructions (Signed)
You have a rather mild and intermittent tremor of both hands. I do not see any signs or symptoms of parkinson's like disease or what we call parkinsonism.   For your tremor, I would recommend a trial of low dose propranolol 20 mg strength: take 1/2 pill to 1 pill up to 3 times a day. You can use it as needed. Common side effect reported are: lethargy, sedation, low blood pressure and low pulse rate. Please monitor your BP and Pulse every few days, if your pulse drops lower than 55 you may feel bad and we may have to adjust your dose. Same with your BP below 110/55.   Please remember, that any kind of tremor may be exacerbated by anxiety, anger, nervousness, excitement, dehydration, sleep deprivation, by caffeine, and low blood sugar values or blood sugar fluctuations. Some medications can exacerbate tremors, this includes antidepressant medications. Please try to drink more water, limit your alcohol intake to 1 serving per day.

## 2018-09-28 ENCOUNTER — Other Ambulatory Visit: Payer: Self-pay | Admitting: Podiatry

## 2018-09-28 ENCOUNTER — Ambulatory Visit: Payer: 59 | Admitting: Podiatry

## 2018-09-28 ENCOUNTER — Ambulatory Visit (INDEPENDENT_AMBULATORY_CARE_PROVIDER_SITE_OTHER): Payer: 59

## 2018-09-28 VITALS — BP 113/73 | HR 70

## 2018-09-28 DIAGNOSIS — M2021 Hallux rigidus, right foot: Secondary | ICD-10-CM

## 2018-09-28 DIAGNOSIS — M779 Enthesopathy, unspecified: Secondary | ICD-10-CM

## 2018-09-28 DIAGNOSIS — M79671 Pain in right foot: Secondary | ICD-10-CM

## 2018-09-29 NOTE — Progress Notes (Signed)
Subjective:   Patient ID: John [name], male   DOB: 58 y.o.   MRN: [id number]   HPI 58 year old male presents the office today for concerns of pain to the right big toe joint which is been ongoing for the last 1 to 2 years.  He states that with certain shoes it causes irritation such as wearing a flexible shoe that rubs.  He states it hurts to flex the toe at times.  He also states that he likes to do a brisk walk and it hurts to the toe.  No recent injury that he can recall.  No recent treatment.  He has no other concerns today.   Review of Systems  All other systems reviewed and are negative.  Past Medical History:  Diagnosis Date  . Arthritis    OA right shoulder  . Cancer (HCC)    BCC of right forearm  . Hyperlipidemia   . Shoulder impingement    right    Past Surgical History:  Procedure Laterality Date  . HERNIA REPAIR Right    IHR  . KNEE ARTHROSCOPY Left   . NOSE SURGERY    . SHOULDER ARTHROSCOPY WITH ROTATOR CUFF REPAIR Right 08/16/2015   Procedure: SHOULDER ARTHROSCOPY WITH ROTATOR CUFF REPAIR;  Surgeon: [name], MD;  Location: [facility];  Service: Orthopedics;  Laterality: Right;  . SHOULDER ARTHROSCOPY WITH SUBACROMIAL DECOMPRESSION Right 08/16/2015   Procedure: SHOULDER ARTHROSCOPY WITH SUBACROMIAL DECOMPRESSION LABRAL DEBRIDEMENT;  Surgeon: [name], MD;  Location: [facility];  Service: Orthopedics;  Laterality: Right;  . SHOULDER SURGERY Right   . SKIN CANCER EXCISION       Current Outpatient Medications:  .  aspirin EC 81 MG tablet, Take 81 mg by mouth daily., Disp: , Rfl:  .  Coenzyme Q10-Fish Oil-Vit E (CO-Q 10 OMEGA-3 FISH OIL) CAPS, Take by mouth., Disp: , Rfl:  .  Omega-3 Fatty Acids (FISH OIL) 1200 MG CAPS, Take 1,200 mg by mouth daily., Disp: , Rfl:  .  propranolol (INDERAL) 20 MG tablet, 1/2 pill to 1 pill up to 3 times a day as needed for tremor., Disp: 90 tablet, Rfl: 5 .  Red Yeast Rice 600 MG TABS,  Take by mouth., Disp: , Rfl:   No Known Allergies        Objective:  Physical Exam  General: AAO x3, NAD  Dermatological: No open lesions identified.  Vascular: Dorsalis Pedis artery and Posterior Tibial artery pedal pulses are 2/4 bilateral with immedate capillary fill time. Pedal hair growth present. No varicosities and no lower extremity edema present bilateral. There is no pain with calf compression, swelling, warmth, erythema.   Neruologic: Grossly intact via light touch bilateral. Vibratory intact via tuning fork bilateral. Protective threshold with Semmes Wienstein monofilament intact to all pedal sites bilateral. Patellar and Achilles deep tendon reflexes 2+ bilateral. No Babinski or clonus noted bilateral.   Musculoskeletal: Bony exostosis palpable of the right first MPJ.  There is decreased injury motion the first MPJ there is crepitation with MPJ range of motion.  There is no's other areas of tenderness elicited at this time.  MMT 5/5.  Gait: Unassisted, Nonantalgic.       Assessment:   Right foot hallux limitus, capsulitis     Plan:  -Treatment options discussed including all alternatives, risks, and complications -Etiology of symptoms were discussed -X-rays were obtained and reviewed with the patient.  Arthritic changes present the first MPJ.  No evidence of  acute fracture. -We discussed shoe modifications and wear stiffer soled shoe.  Can also consider custom orthotics. -Discussed steroid injection but he wants to hold off today as he is having minimal discomfort.  Anti-inflammatories as needed.  In the future can also consider surgical intervention if symptoms continue.  We discussed MPJ arthrodesis.  Trula Slade DPM

## 2018-10-13 ENCOUNTER — Other Ambulatory Visit: Payer: 59 | Admitting: Orthotics

## 2018-10-25 ENCOUNTER — Telehealth: Payer: Self-pay | Admitting: Neurology

## 2018-10-25 NOTE — Telephone Encounter (Signed)
Pt preferred to delay his appt.

## 2018-10-25 NOTE — Telephone Encounter (Signed)
LVM regarding scheduling virtual visit due to office being closed. Requested patient call us back.

## 2018-10-27 ENCOUNTER — Ambulatory Visit: Payer: 59 | Admitting: Neurology

## 2018-11-22 ENCOUNTER — Other Ambulatory Visit: Payer: Self-pay

## 2018-11-22 ENCOUNTER — Ambulatory Visit (INDEPENDENT_AMBULATORY_CARE_PROVIDER_SITE_OTHER): Payer: 59 | Admitting: Orthotics

## 2018-11-22 VITALS — Temp 98.1°F

## 2018-11-22 DIAGNOSIS — M2021 Hallux rigidus, right foot: Secondary | ICD-10-CM

## 2018-11-22 DIAGNOSIS — M779 Enthesopathy, unspecified: Secondary | ICD-10-CM

## 2018-11-22 NOTE — Progress Notes (Signed)
Patient came into today to be cast for Custom Foot Orthotics. Upon recommendation of Dr. Jacqualyn Posey Patient presents with hallux rigidus RT Goals are locking down moving 1st mpj RT  Plan vendor [facility] extensionPatient came into today to be cast for Custom Foot Orthotics. Upon recommendation of Dr.  Patient presents with Goals are Plan vendor

## 2018-12-16 ENCOUNTER — Other Ambulatory Visit: Payer: Self-pay

## 2018-12-16 ENCOUNTER — Ambulatory Visit: Payer: 59 | Admitting: Orthotics

## 2018-12-16 DIAGNOSIS — M2021 Hallux rigidus, right foot: Secondary | ICD-10-CM

## 2018-12-16 DIAGNOSIS — M779 Enthesopathy, unspecified: Secondary | ICD-10-CM

## 2018-12-16 NOTE — Progress Notes (Signed)
Patient came in today to pick up custom made foot orthotics.  The goals were accomplished and the patient reported no dissatisfaction with said orthotics.  Patient was advised of breakin period and how to report any issues. 

## 2019-01-17 ENCOUNTER — Ambulatory Visit: Payer: 59 | Admitting: Neurology

## 2019-01-17 ENCOUNTER — Encounter: Payer: Self-pay | Admitting: Neurology

## 2019-01-17 ENCOUNTER — Other Ambulatory Visit: Payer: Self-pay

## 2019-01-17 VITALS — BP 119/77 | HR 57 | Temp 97.3°F | Ht 70.0 in | Wt 172.0 lb

## 2019-01-17 DIAGNOSIS — R413 Other amnesia: Secondary | ICD-10-CM

## 2019-01-17 DIAGNOSIS — R419 Unspecified symptoms and signs involving cognitive functions and awareness: Secondary | ICD-10-CM | POA: Diagnosis not present

## 2019-01-17 DIAGNOSIS — G250 Essential tremor: Secondary | ICD-10-CM

## 2019-01-17 NOTE — Patient Instructions (Signed)
You have complaints of memory loss: memory loss or changes in cognitive function can have many reasons and does not always mean you have dementia. Conditions that can contribute to subjective or objective memory loss include: depression, stress, poor sleep from insomnia or sleep apnea, dehydration, fluctuation in blood sugar values, thyroid or electrolyte dysfunction and certain vitamin deficiencies. Dementia can be caused by stroke, brain atherosclerosis or brain vascular disease due to vascular risk factors (smoking, high blood pressure, high cholesterol, obesity and uncontrolled diabetes), certain degenerative brain disorders (including Parkinson's disease and Multiple sclerosis) and by Alzheimer's disease or other, more rare and sometimes hereditary causes. We will do some additional testing: blood work, and we will do a brain scan. We will also request a formal cognitive test called neuropsychological evaluation which is done by a licensed neuropsychologist. We will make a referral in that regard. We will call you with brain scan test results and monitor your symptoms. Your memory loss is rather mild at this point, which, of course is reassuring.  Continue with the Propranolol as needed for your hand tremor.

## 2019-01-17 NOTE — Progress Notes (Signed)
Subjective:    Patient ID: John [name] is a 58 y.o. male.  HPI     Interim history:   Mr. John [name] is a 58 year old right-handed gentleman with an underlying medical history of hyperlipidemia, allergic rhinitis, bilateral shoulder surgeries, and overweight state, who presents for follow-up consultation of his hand tremors.  The patient is unaccompanied today.  I first met him on 07/27/2018 at the request of his primary care physician, at which time he reported an approximately 5-year history of bilateral hand tremors, right a little more than left.  On examination, he had findings in keeping with essential tremor, no parkinsonism.  He was advised to start a trial of propranolol for symptomatic treatment.  He had also concerns about his memory.  Today, 01/17/2019: He reports that the propranolol is somewhat helpful for his tremor, he only takes half a pill as needed.  He has not noticed any side effects.  He continues to worry about his memory.  He has short-term difficulty but also long-term difficulties such as not being able to remember movies he has watched or things that has happened in his kids past.  His family has voiced some concern about it but more so jokingly.  He denies any significant progression of his memory issues.  His father had memory loss, started in his early 99s, he lived to be 59 years old.  Patient does not know if his father had medication for his memory loss or any formal diagnosis of dementia.  His mother lived to be 50, she died from pancreatic cancer.  He has an older brother and a sister.  His older brother has hand tremors. He is trying to hydrate a little bit better.  He estimates that he gets about 4 to 5 cups of water per day.  Previously:   07/27/2018: (He) reports a bilateral hand tremor for the past few years, about 5 years, affecting both hands, perhaps the right more than left but he is right-handed. He is not impaired in his day-to-day activities but finds the  tremor socially embarrassing. It has progressed with time. Tremor is typically with certain activity or with holding something such as a cup or a paper. No resting tremor, no lower extremity tremor, no recent falls, no balance issues.  There is no obvious family history of tremors, no family history of Parkinson's disease. He does have a family history of dementia affecting his father and he is worried about memory loss. He has been more forgetful. He is family especially his wife has not voiced any concern about his memory function he feels that he may have a problem. I reviewed your office note from 06/16/2018, which you kindly included. He quit smoking in 2001, and drinks alcohol in the form of beer or a mixed drink, 2-3 per night on average. He drinks caffeine, 2 servings per day on average. He is married and lives with his wife, he works from home, they have 3 children. He does not always hydrate very well he admits.   His Past Medical History Is Significant For: Past Medical History:  Diagnosis Date  . Arthritis    OA right shoulder  . Cancer (HCC)    BCC of right forearm  . Hyperlipidemia   . Shoulder impingement    right    His Past Surgical History Is Significant For: Past Surgical History:  Procedure Laterality Date  . HERNIA REPAIR Right    IHR  . KNEE ARTHROSCOPY Left   .  NOSE SURGERY    . SHOULDER ARTHROSCOPY WITH ROTATOR CUFF REPAIR Right 08/16/2015   Procedure: SHOULDER ARTHROSCOPY WITH ROTATOR CUFF REPAIR;  Surgeon: [name], MD;  Location: [facility];  Service: Orthopedics;  Laterality: Right;  . SHOULDER ARTHROSCOPY WITH SUBACROMIAL DECOMPRESSION Right 08/16/2015   Procedure: SHOULDER ARTHROSCOPY WITH SUBACROMIAL DECOMPRESSION LABRAL DEBRIDEMENT;  Surgeon: [name], MD;  Location: [facility];  Service: Orthopedics;  Laterality: Right;  . SHOULDER SURGERY Right   . SKIN CANCER EXCISION      His Family History Is Significant  For: No family history on file.  His Social History Is Significant For: Social History   Socioeconomic History  . Marital status: Married    Spouse name: Not on file  . Number of children: Not on file  . Years of education: Not on file  . Highest education level: Not on file  Occupational History  . Not on file  Social Needs  . Financial resource strain: Not on file  . Food insecurity    Worry: Not on file    Inability: Not on file  . Transportation needs    Medical: Not on file    Non-medical: Not on file  Tobacco Use  . Smoking status: Never Smoker  . Smokeless tobacco: Never Used  Substance and Sexual Activity  . Alcohol use: Yes    Comment: drinks 1-2 drinks/night  . Drug use: No  . Sexual activity: Not on file  Lifestyle  . Physical activity    Days per week: Not on file    Minutes per session: Not on file  . Stress: Not on file  Relationships  . Social Herbalist on phone: Not on file    Gets together: Not on file    Attends religious service: Not on file    Active member of club or organization: Not on file    Attends meetings of clubs or organizations: Not on file    Relationship status: Not on file  Other Topics Concern  . Not on file  Social History Narrative  . Not on file    His Allergies Are:  No Known Allergies:   His Current Medications Are:  Outpatient Encounter Medications as of 01/17/2019  Medication Sig  . aspirin EC 81 MG tablet Take 81 mg by mouth daily.  . Coenzyme Q10-Fish Oil-Vit E (CO-Q 10 OMEGA-3 FISH OIL) CAPS Take by mouth.  . Omega-3 Fatty Acids (FISH OIL) 1200 MG CAPS Take 1,200 mg by mouth daily.  . propranolol (INDERAL) 20 MG tablet 1/2 pill to 1 pill up to 3 times a day as needed for tremor.  . Red Yeast Rice 600 MG TABS Take by mouth.   No facility-administered encounter medications on file as of 01/17/2019.   :  Review of Systems:  Out of a complete 14 point review of systems, all are reviewed and negative  with the exception of these symptoms as listed below:  Review of Systems  Neurological:       Pt presents today to discuss his memory and tremor. Pt reports that he takes 1/2 tablet of propranolol PRN. He reports that he has trouble remembering things.    Objective:  Neurological Exam  Physical Exam  Physical Examination:   Vitals:   01/17/19 0839  BP: 119/77  Pulse: (!) 57  Temp: (!) 97.3 F (36.3 C)    General Examination: The patient is a very pleasant 58 y.o. male  in no acute distress. He appears well-developed and well-nourished and well groomed.   HEENT: Normocephalic, atraumatic, pupils are equal, round and reactive to light and accommodation. Extraocular tracking is good without limitation to gaze excursion or nystagmus noted. Normal smooth pursuit is noted. Hearing is grossly intact. Face is symmetric with normal facial animation and normal facial sensation. Speech is clear with no dysarthria noted. There is no hypophonia. There is no lip, neck/head, jaw or voice tremor. Neck is supple with full range of passive and active motion. There are no carotid bruits on auscultation. Oropharynx exam reveals: moderate mouth dryness, adequate dental hygiene. Tongue protrudes centrally and palate elevates symmetrically.  Chest: Clear to auscultation without wheezing, rhonchi or crackles noted.  Heart: S1+S2+0, regular and normal without murmurs, rubs or gallops noted.   Abdomen: Soft, non-tender and non-distended with normal bowel sounds appreciated on auscultation.  Extremities: There is no pitting edema in the distal lower extremities bilaterally. Pedal pulses are intact.  Skin: Warm and dry without trophic changes noted.  Musculoskeletal: exam reveals no obvious joint deformities, tenderness or joint swelling or erythema.   Neurologically:  Mental status: The patient is awake, alert and oriented in all 4 spheres. His immediate and remote memory, attention, language skills  and fund of knowledge are appropriate. There is no evidence of aphasia, agnosia, apraxia or anomia. Speech is clear with normal prosody and enunciation. Thought process is linear. Mood is normal and affect is normal.  Cranial nerves II - XII are as described above under HEENT exam. In addition: shoulder shrug is normal with equal shoulder height noted.  On 01/17/2019: MMSE: 27/30 (He missed season and doctor's name, and 1 point on the physical task of taking paper in his hand).  Motor exam: Normal bulk, strength and tone is noted. There is no drift, resting tremor or rebound.   (On 07/27/2018: on Archimedes spiral drawing he has minimal trembling with the left hand, no significant trembling noted with his right hand. Handwriting with his right hand is legible, slightly on the smaller side, not particularly tremulous.)   He has a slight bilateral upper extremity postural tremor, mild action tremor bilaterally, postural tremor is again a little more noticeable on the left, no intention tremor, no resting tremor.  Romberg is negative. Reflexes are 2+ throughout. Fine motor skills and coordination: intact with normal finger taps, normal hand movements, normal rapid alternating patting, normal foot taps and normal foot agility.  Cerebellar testing: No dysmetria or intention tremor on finger to nose testing. There is no truncal or gait ataxia.  Sensory exam: intact to light touch.  Gait, station and balance: He stands easily. No veering to one side is noted. No leaning to one side is noted. Posture is age-appropriate and stance is narrow based. Gait shows normal stride length and normal pace. No problems turning are noted. Tandem walk is unremarkable.   Assessment and Plan:   In summary, YEUDIEL MATEO is a very pleasant 58 year old male with an underlying medical history of hyperlipidemia, allergic rhinitis, bilateral shoulder surgeries, and overweight state, who presents for Follow-up consultation of  his essential tremor for which he has been on low-dose propranolol as needed.  He reports that he has noticed slight improvement in his tremor when he takes the medication.  He does not take it consistently.  He has not noticed any side effects.  He has ongoing complaints about his memory.  His MMSE was slightly abnormal today.  He reports a family  history of memory loss affecting his father in his early 46s.  He can continue to use propranolol as needed at low-dose. We will proceed with further evaluation of his memory loss with blood work, brain MRI and I will also request a more formal/comprehensive memory evaluation with the help of a neuropsychologist, I made a referral in that regard.  We will continue to monitor his tremor and his memory.  I suggested a 2-monthfollow-up.  We will keep him posted as to his test results in the interim as they come in.  I answered all his questions today and he was in agreement. I spent 25 minutes in total face-to-face time with the patient, more than 50% of which was spent in counseling and coordination of care, reviewing test results, reviewing medication and discussing or reviewing the diagnosis of ET and memory loss, the prognosis and treatment options. Pertinent laboratory and imaging test results that were available during this visit with the patient were reviewed by me and considered in my medical decision making (see chart for details).

## 2019-01-18 ENCOUNTER — Telehealth: Payer: Self-pay | Admitting: Neurology

## 2019-01-18 ENCOUNTER — Telehealth: Payer: Self-pay

## 2019-01-18 LAB — COMPREHENSIVE METABOLIC PANEL
ALT: 20 IU/L (ref 0–44)
AST: 23 IU/L (ref 0–40)
Albumin/Globulin Ratio: 2.1 (ref 1.2–2.2)
Albumin: 4.4 g/dL (ref 3.8–4.9)
Alkaline Phosphatase: 58 IU/L (ref 39–117)
BUN/Creatinine Ratio: 13 (ref 9–20)
BUN: 13 mg/dL (ref 6–24)
Bilirubin Total: 0.5 mg/dL (ref 0.0–1.2)
CO2: 24 mmol/L (ref 20–29)
Calcium: 9.2 mg/dL (ref 8.7–10.2)
Chloride: 101 mmol/L (ref 96–106)
Creatinine, Ser: 1.01 mg/dL (ref 0.76–1.27)
GFR calc Af Amer: 94 mL/min/{1.73_m2} (ref >59)
GFR calc non Af Amer: 82 mL/min/{1.73_m2} (ref >59)
Globulin, Total: 2.1 g/dL (ref 1.5–4.5)
Glucose: 104 mg/dL — ABNORMAL HIGH (ref 65–99)
Potassium: 4.3 mmol/L (ref 3.5–5.2)
Sodium: 137 mmol/L (ref 134–144)
Total Protein: 6.5 g/dL (ref 6.0–8.5)

## 2019-01-18 LAB — CBC WITH DIFFERENTIAL/PLATELET
Basophils Absolute: 0 10*3/uL (ref 0.0–0.2)
Basos: 1 %
EOS (ABSOLUTE): 0.1 10*3/uL (ref 0.0–0.4)
Eos: 2 %
Hematocrit: 39.8 % (ref 37.5–51.0)
Hemoglobin: 13.2 g/dL (ref 13.0–17.7)
Immature Grans (Abs): 0 10*3/uL (ref 0.0–0.1)
Immature Granulocytes: 0 %
Lymphocytes Absolute: 0.8 10*3/uL (ref 0.7–3.1)
Lymphs: 20 %
MCH: 27.8 pg (ref 26.6–33.0)
MCHC: 33.2 g/dL (ref 31.5–35.7)
MCV: 84 fL (ref 79–97)
Monocytes Absolute: 0.5 10*3/uL (ref 0.1–0.9)
Monocytes: 12 %
Neutrophils Absolute: 2.7 10*3/uL (ref 1.4–7.0)
Neutrophils: 65 %
Platelets: 190 10*3/uL (ref 150–450)
RBC: 4.74 x10E6/uL (ref 4.14–5.80)
RDW: 13.7 % (ref 11.6–15.4)
WBC: 4 10*3/uL (ref 3.4–10.8)

## 2019-01-18 LAB — B12 AND FOLATE PANEL
Folate: 12 ng/mL (ref >3.0)
Vitamin B-12: 497 pg/mL (ref 232–1245)

## 2019-01-18 LAB — TSH: TSH: 1.53 u[IU]/mL (ref 0.450–4.500)

## 2019-01-18 LAB — HGB A1C W/O EAG: Hgb A1c MFr Bld: 5.5 % (ref 4.8–5.6)

## 2019-01-18 LAB — RPR: RPR Ser Ql: NONREACTIVE

## 2019-01-18 NOTE — Telephone Encounter (Signed)
-----   Message from Star Age, MD sent at 01/18/2019 10:56 AM EDT ----- Labs fine, please update pt. Will proceed with MRI br and as planned, referral to neuropsychology.  Michel Bickers

## 2019-01-18 NOTE — Telephone Encounter (Signed)
I called pt and updated him on his lab work results. Pt verbalized understanding of results. Pt had no questions at this time but was encouraged to call back if questions arise.

## 2019-01-18 NOTE — Telephone Encounter (Signed)
no to the covid-19 questions MR Brain w/wo contrast Dr. Rexene Alberts [facility] Auth: [place] via [facility]. Patient is scheduled at [facility] for 02/01/19

## 2019-01-18 NOTE — Progress Notes (Signed)
Labs fine, please update pt. Will proceed with MRI br and as planned, referral to neuropsychology.  Michel Bickers

## 2019-01-31 NOTE — Telephone Encounter (Signed)
pt would like to go to [facility] faxed order they will reach out to the patient. the pt is aware. Their phone number is [phone] & fax # [phone].

## 2019-02-01 ENCOUNTER — Other Ambulatory Visit: Payer: 59

## 2019-02-23 ENCOUNTER — Telehealth: Payer: Self-pay

## 2019-02-23 NOTE — Telephone Encounter (Signed)
I called pt to discuss his MRI results. No answer, left a message asking him to call me back.

## 2019-02-23 NOTE — Telephone Encounter (Signed)
Received MRI brain results from [facility] dated 02/22/2019.

## 2019-02-23 NOTE — Telephone Encounter (Signed)
Pt returned my call, I advised him of his MRI results and recommendations. Pt verbalized understanding of results. Pt had no questions at this time but was encouraged to call back if questions arise.

## 2019-02-23 NOTE — Telephone Encounter (Signed)
Brain MRI with and without contrast through [facility], from 02/22/2019 was reported as normal.  Please update patient. FU as scheduled.

## 2019-03-11 ENCOUNTER — Encounter: Payer: Self-pay | Admitting: Psychology

## 2019-05-17 ENCOUNTER — Telehealth: Payer: Self-pay | Admitting: Neurology

## 2019-05-17 NOTE — Telephone Encounter (Signed)
Pts is calling in wanting to know if his upcoming appt is neccessary, he states the referral that was placed to the other doctor hasnt been scheduled yet.

## 2019-05-17 NOTE — Telephone Encounter (Signed)
I called pt. He reports that his neuro psych appt is not until January. He wants to push his appt out until after that. A new appt was made for 08/22/19. The appt on 10/29 was cancelled. Pt verbalized understanding of new appt date and time.

## 2019-05-19 ENCOUNTER — Ambulatory Visit: Payer: 59 | Admitting: Neurology

## 2019-07-25 ENCOUNTER — Other Ambulatory Visit: Payer: Self-pay

## 2019-07-25 ENCOUNTER — Encounter: Payer: 59 | Attending: Psychology | Admitting: Psychology

## 2019-07-25 ENCOUNTER — Encounter: Payer: Self-pay | Admitting: Psychology

## 2019-07-25 DIAGNOSIS — R4189 Other symptoms and signs involving cognitive functions and awareness: Secondary | ICD-10-CM

## 2019-07-25 DIAGNOSIS — R413 Other amnesia: Secondary | ICD-10-CM | POA: Insufficient documentation

## 2019-07-25 NOTE — Progress Notes (Signed)
Neuropsychological Consultation   Patient:   John [name]   DOB:   Jul 01, 1961  MR Number:  [id number]  Location:  [facility] [place], [place] [id number] [facility] [place] [address] Dept: [phone]           Date of Service:   07/25/2019  Start Time:   1 PM End Time:   3 PM  Today's visit was an in person visit that was conducted in my outpatient clinic office.  The patient myself were present for this visit.  1 hour was spent in a face-to-face clinical interview and 1 hour spent with records review and report writing.  Provider/Observer:  Ilean Skill, Psy.D.       Clinical Neuropsychologist       Billing Code/Service: [phone], [phone]  Chief Complaint:    John [name] is a 59 year old male referred by Dr. Rexene Alberts for neuropsychological evaluation due to the patient's complaints of memory difficulties both short-term and long-term that have been present for the past several years.  The patient describes these memory changes is rather steady and not apparently progressing.  He also describes some mild word finding difficulties and some executive functioning difficulties related to estimation and calculations.  The patient also has reports of bilateral hand tremor over the past 5 or 6 years affecting both hands but right apparently somewhat more than the left.  While there is no family history of Parkinson's disease and he did not initially know that his brother had similar symptoms recent discussions with his brother had confirmed that his brother also has similar hand tremor.  They have been diagnosed as essential tremor.  Reason for Service:  John [name] is a 59 year old male referred by Dr. Rexene Alberts for neuropsychological evaluation due to the patient's complaints of memory difficulties both short-term and long-term that have been present for the past several years.  The patient  describes these memory changes is rather steady and not apparently progressing.  He also describes some mild word finding difficulties and some executive functioning difficulties related to estimation and calculations.  The patient also has reports of bilateral hand tremor over the past 5 or 6 years affecting both hands but right apparently somewhat more than the left.  While there is no family history of Parkinson's disease and he did not initially know that his brother had similar symptoms recent discussions with his brother had confirmed that his brother also has similar hand tremor.  They have been diagnosed as essential tremor.  The patient reports that his father had some difficulties with memory and cognition in his latter years.  The patient reports that his father developed some memory difficulties and was diagnosed with dementia in his early 51s and died at 14.  The patient's mother died at 17 of pancreatic cancer.  The patient does report that he worries about developing memory loss and he feels like he has been more forgetful but his family are not noticing or voicing a great deal of concern around memory difficulties.  The patient had an MRI conducted recently that showed no acute process or any indications of structural abnormalities of the brain.  The patient reports that his mood is good and denies any significant depression or anxiety symptoms.  The patient also reports he feels like his attention and concentration are okay.  The patient does report that he is told by his wife that he snores significantly  when sleeping but reports that he wakes up feeling rested in the morning.  The patient had a mental status exam conducted on one of his previous neurological appointments scoring a 27 of 30 and the only items missed had to do with season of the year, doctor's name and one error for taking the paper from the physicians hand with both hands rather than 1 as instructed.  The patient reports that  he has lost 35 pounds over the past year and a purposeful attempt to lose weight.  Reliability of Information: Information is derived from 1 hour face-to-face clinical interview as well as review of available medical records.  Behavioral Observation: John [name]  presents as a 59 y.o.-year-old Right Caucasian Male who appeared his stated age. his dress was Appropriate and he was Well Groomed and his manners were Appropriate to the situation.  his participation was indicative of Appropriate and Attentive behaviors.  There were not any physical disabilities noted.  he displayed an appropriate level of cooperation and motivation.     Interactions:    Active Appropriate and Attentive  Attention:   within normal limits and attention span and concentration were age appropriate  Memory:   abnormal; remote memory intact, recent memory impaired  Visuo-spatial:  not examined  Speech (Volume):  normal  Speech:   normal; normal  Thought Process:  Coherent and Relevant  Though Content:  WNL; not suicidal and not homicidal  Orientation:   person, place, time/date and situation  Judgment:   Good  Planning:   Good  Affect:    Appropriate  Mood:    Euthymic  Insight:   Good  Intelligence:   normal  Marital Status/Living: The patient was born in [place] with 2 siblings.  He is married to his wife of 36 years and continues to live with her.  They have 4 children that are all adults.  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  Hobbies and interests include mountain biking and going to the beach.  Substance Use:  The patient denies any substance abuse and reports that he has 2 drinks of alcohol per day.  Education:   College the patient received his bachelor's degree with a 3.2 GPA from [employer] with his best subject of math and his were subject [place].  Medical  History:   Past Medical History:  Diagnosis Date  . Arthritis    OA right shoulder  . Cancer (HCC)    BCC of right forearm  . Hyperlipidemia   . Shoulder impingement    right     Psychiatric History:  The patient denies any prior psychiatric history.  Family Med/Psych History: History reviewed. No pertinent family history.  Impression/DX:  John [name] is a 59 year old male referred by Dr. Rexene Alberts for neuropsychological evaluation due to the patient's complaints of memory difficulties both short-term and long-term that have been present for the past several years.  The patient describes these memory changes is rather steady and not apparently progressing.  He also describes some mild word finding difficulties and some executive functioning difficulties related to estimation and calculations.  The patient also has reports of bilateral hand tremor over the past 5 or 6 years affecting both hands but right apparently somewhat more than the left.  While there is no family history of Parkinson's disease and he did not initially know that  his brother had similar symptoms recent discussions with his brother had confirmed that his brother also has similar hand tremor.  They have been diagnosed as essential tremor.  Disposition/Plan:  The patient has been set up for formal neuropsychological testing utilizing the Wechsler Adult Intelligence Scale-for as well as the [employer].  At this point, I doubt we will need further assessment procedures but once the testing that has been set up is complete we will make a determination whether other test measures would be needed to answer diagnostic/referral question.  Diagnosis:    Subjective memory complaints  Memory loss         Electronically Signed   _______________________ Ilean Skill, Psy.D.

## 2019-07-29 ENCOUNTER — Encounter: Payer: 59 | Admitting: Psychology

## 2019-08-01 ENCOUNTER — Other Ambulatory Visit: Payer: Self-pay

## 2019-08-01 ENCOUNTER — Encounter: Payer: 59 | Admitting: Psychology

## 2019-08-01 ENCOUNTER — Encounter: Payer: Self-pay | Admitting: Psychology

## 2019-08-01 DIAGNOSIS — R4189 Other symptoms and signs involving cognitive functions and awareness: Secondary | ICD-10-CM

## 2019-08-01 NOTE — Progress Notes (Addendum)
The patient arrived on time to his 13:00 testing appointment, which lasted 240 minutes.  Behavioral Observations:  Appearance: Casually and appropriately dressed with good hygiene. Gait: Ambulated independently without assistance. Speech: Clear, normal rate, normal tone & volume. Thought process:  Linear, organized, logical.  Mood/Affect: Moderately anxious & mildly depressed, appropriate.  Interpersonal: Polite and appropriate. Orientation: Oriented x 4 Effort/Motivation: Good   He did not appear to have difficulty seeing, hearing, or understanding test items/questions and did not require much additional prompting. He exhibited good distress tolerance on questions he did not know or tasks that were more difficult. He was cooperative with all assigned tasks.   Tests Administered: . Clock Drawing Test . Wechsler Adult Intelligence Scale, 4th Edition (WAIS-IV) . Wechsler Memory Scale, 4th edition, Adult Battery (WMS-IV-A) Results:   Clock Drawing Test . WNL  WAIS-IV   Composite Score Summary  Scale Sum of Scaled Scores Composite Score Percentile Rank 95% Conf. Interval Qualitative Description  Verbal Comprehension 34 VCI 107 68 101-112 Average  Perceptual Reasoning 43 PRI 125 95 118-130 Superior  Working Memory 32 WMI 133 99 124-138 Very Superior  Processing Speed 22 PSI 105 63 96-113 Average  Full Scale 131 FSIQ 121 92 117-125 Superior  General Ability 77 GAI 118 88 113-122 High Average   Index Level Discrepancy Comparisons  Comparison Score 1 Score 2 Difference Critical Value .05 Significant Difference Y/N Base Rate by Overall Sample  VCI - PRI 107 125 -18 8.31 Y 9.1  VCI - WMI 107 133 -26 8.82 Y 2.4  VCI - PSI 107 105 2 10.19 N 46.2  PRI - WMI 125 133 -8 9.74 N 28.9  PRI - PSI 125 105 20 11.00 Y 9.7  WMI - PSI 133 105 28 11.38 Y 3.6  FSIQ - GAI 121 118 3 3.51 N 30.1   Differences Between Subtest and Overall Mean of Subtest Scores  Subtest Subtest Scaled Score  Mean Scaled Score Difference Critical Value .05 Strength or Weakness Base Rate  Block Design 14 13.10 0.90 2.85  >25%  Similarities 13 13.10 -0.10 2.82  >25%  Digit Span 17 13.10 3.90 2.22 S 5-10%  Matrix Reasoning 14 13.10 0.90 2.54  >25%  Vocabulary 13 13.10 -0.10 2.03  >25%  Arithmetic 15 13.10 1.90 2.73  >25%  Symbol Search 10 13.10 -3.10 3.42  15-25%  Visual Puzzles 15 13.10 1.90 2.71  >25%  Information 8 13.10 -5.10 2.19 W 1-2%  Coding 12 13.10 -1.10 2.97  >25%   WMS-IV (Adult Battery)  Index Score Summary  Index Sum of Scaled Scores Index Score Percentile Rank 95% Confidence Interval Qualitative Descriptor  Auditory Memory (AMI) 22 133 18 125-138 Very Superior  Visual Memory (VMI) 49 121 92 115-126 Superior  Visual Working Memory (VWMI) 31 133 99 123-138 Very Superior  Immediate Memory (IMI) 55 127 96 119-132 Superior  Delayed Memory (DMI) 59 133 99 124-138 Very Superior   Primary Subtest Scaled Score Summary  Subtest Domain Raw Score Scaled Score Percentile Rank  Logical Memory I AM 42 16 98  Logical Memory II AM 43 18 99.6  Verbal Paired Associates I AM 40 13 84  Verbal Paired Associates II AM 13 14 91  Designs I VM 75 12 75  Designs II VM 62 12 75  Visual Reproduction I VM 41 14 91  Visual Reproduction II VM 37 15 95  Spatial Addition VWM 21 17 99  Symbol Span VWM 30 14 91    PROCESS  SCORE CONVERSIONS  Auditory Memory Process Score Summary  Process Score Raw Score Scaled Score Percentile Rank Cumulative Percentage (Base Rate)  LM II Recognition 30 - - >75%  VPA II Recognition 40 - - >75%    Visual Memory Process Score Summary  Process Score Raw Score Scaled Score Percentile Rank Cumulative Percentage (Base Rate)  DE I Content 29 7 16  -  DE I Spatial 20 13 84 -  DE II Content 36 12 75 -  DE II Spatial 12 10 50 -  DE II Recognition 13 - - 26-50%  VR II Recognition 7 - - >75%    ABILITY-MEMORY ANALYSIS  Ability Score:  GAI: 118 Date of Testing:   WAIS-IV; WMS-IV 2019/08/01  Predicted Difference Method   Index Predicted WMS-IV Index Score Actual WMS-IV Index Score Difference Critical Value  Significant Difference Y/N Base Rate  Auditory Memory 110 133 -23 8.95 Y 4%  Visual Memory 111 121 -10 8.82 Y 20-25%  Visual Working Memory 112 133 -21 11.24 Y 3%  Immediate Memory 112 127 -15 10.35 Y 10%  Delayed Memory 110 133 -23 10.08 Y 3%  Statistical significance (critical value) at the .01 level.   Contrast Scaled Scores  Score Score 1 Score 2 Contrast Scaled Score  General Ability Index vs. Auditory Memory Index 118 133 16  General Ability Index vs. Visual Memory Index 118 121 13  General Ability Index vs. Visual Working Memory Index 118 133 16  General Ability Index vs. Immediate Memory Index 118 127 15  General Ability Index vs. Delayed Memory Index 118 133 16  Verbal Comprehension Index vs. Auditory Memory Index 107 133 17  Perceptual Reasoning Index vs. Visual Memory Index 125 121 12  Perceptual Reasoning Index vs. Visual Working Memory Index 125 133 14  Working Memory Index vs. Auditory Memory Index 133 133 16  Working Memory Index vs. Visual Working Memory Index 133 133 14

## 2019-08-05 ENCOUNTER — Encounter: Payer: Self-pay | Admitting: Psychology

## 2019-08-05 ENCOUNTER — Other Ambulatory Visit: Payer: Self-pay

## 2019-08-05 ENCOUNTER — Encounter (HOSPITAL_BASED_OUTPATIENT_CLINIC_OR_DEPARTMENT_OTHER): Payer: 59 | Admitting: Psychology

## 2019-08-05 DIAGNOSIS — R4189 Other symptoms and signs involving cognitive functions and awareness: Secondary | ICD-10-CM

## 2019-08-05 NOTE — Progress Notes (Addendum)
Neuropsychological Evaluation   Patient:  John [name]   DOB: 02-Nov-1960  MR Number: [id number]  Location: [place] [facility] PHYSICAL MEDICINE AND REHABILITATION [place], [name] [place] [address] Dept: [phone]  Start: 8 AM End: 9 AM  Provider/Observer:     Edgardo Roys PsyD  Chief Complaint:      Chief Complaint  Patient presents with  . Memory Loss    Reason For Service:     John [name] is a 59 year old male referred by Dr. Rexene [name] for neuropsychological evaluation due to the patient's complaints of memory difficulties both short-term and long-term that have been present for the past several years.  The patient describes these memory changes as rather steady and not apparently progressing.  He also describes some mild word finding difficulties and some executive functioning difficulties related to estimation and calculations.  The patient also has reports of bilateral hand tremor over the past 5 or 6 years affecting both hands but right apparently somewhat more than the left.  While there is no family history of Parkinson's disease and he did not initially know that his brother had similar symptoms but recent discussions with his brother had confirmed that his brother also has similar hand tremor.  His brother has been diagnosed as essential tremor.  The patient reports that his father had some difficulties with memory and cognition in his latter years.  The patient reports that his father developed some memory difficulties and was diagnosed with dementia in his early 75s and died at 51.  The patient's mother died at 6 of pancreatic cancer.  The patient does report that he worries about developing memory loss and he feels like he has been more forgetful but his family are not noticing or voicing a great deal of concern around memory difficulties.  The patient had an MRI conducted recently  that showed no acute process or any indications of structural abnormalities of the brain.  The patient reports that his mood is good and denies any significant depression or anxiety symptoms.  The patient also reports he feels like his attention and concentration are okay.  The patient does report that he is told by his wife that he snores significantly when sleeping but reports that he wakes up feeling rested in the morning.  The patient had a mental status exam conducted on one of his previous neurological appointments scoring a 27 of 30 and the only items missed had to do with season of the year, doctor's name and one error for taking the paper from the physicians hand with both hands rather than 1 as instructed.  The patient reports that he has lost 35 pounds over the past year and a purposeful attempt to lose weight.  Behavioral Observations: Appearance:Casually and appropriately dressed with good hygiene. Gait:Ambulated independently without assistance. Speech:Clear, normal rate, normal tone & volume. Thought process: Linear, organized, logical.  Mood/Affect:Moderately anxious & mildly depressed, appropriate.  Interpersonal: Polite and appropriate. Orientation: Oriented x 4 Effort/Motivation: Good   He did not appear to have difficulty seeing, hearing, or understanding test items/questions and did not require much additional prompting. He exhibited good distress tolerance (e.g. became frustrated) on questions he did not know or tasks that were more difficult. He was cooperative with all assigned tasks.   Tests Administered:  Clock Drawing [employer] Adult Intelligence Scale, 4th Edition (WAIS-IV)  Wechsler Memory Scale, 4th edition, Adult Battery (WMS-IV-A)  Test Results:   Initially, an estimation was made as to historical/premorbid cognitive functioning based on the patient's education occupation as well as psychosocial variables.  It is estimated that the patient's global  intellectual and cognitive functioning's would have range somewhere between 110 and 120 or higher on standard T score measures of various cognitive domains.  Composite Score Summary  Scale Sum of Scaled Scores Composite Score Percentile Rank 95% Conf. Interval Qualitative Description  Verbal Comprehension 34 VCI 107 68 101-112 Average  Perceptual Reasoning 43 PRI 125 95 118-130 Superior  Working Memory 32 WMI 133 99 124-138 Very Superior  Processing Speed 22 PSI 105 63 96-113 Average  Full Scale 131 FSIQ 121 92 117-125 Superior  General Ability 77 GAI 118 88 113-122 High Average   The patient produced a full-scale IQ score of 121 which falls at the 92nd percentile and in the superior range.  This global composite score generally falls in a range that is consistent with premorbid estimations.  We also calculated the patient's general abilities score which places less emphasis on measures subject to acute changes such as working memory and information processing speed.  The patient produced a general abilities index score of 118 which falls at the 88th percentile and is in the upper end of the high average range.  Both of these composite scores are within estimations of historical functioning for the patient.   Verbal Comprehension Subtests Summary  Subtest Raw Score Scaled Score Percentile Rank Reference Group Scaled Score SEM  Similarities 30 13 84 13 1.08  Vocabulary 48 13 84 14 0.73  Information 10 8 25 8  0.67   The patient produced a verbal comprehension index score of 107 which falls at the 68th percentile and is in the average range.  There was some variability in subtest scores.  The patient did very well and performed in the high average range on measures of verbal reasoning and problem-solving and his basic vocabulary knowledge.  The patient had some weakness with regard to his general fund of information although this performance may be more indicative and reflective of his  specialized focus in his education and occupation.  Perceptual Reasoning Subtests Summary  Subtest Raw Score Scaled Score Percentile Rank Reference Group Scaled Score SEM  Block Design 54 14 91 12 1.04  Matrix Reasoning 21 14 91 12 0.95  Visual Puzzles 19 15 95 12 0.99   The patient produced a perceptual reasoning index score of 125 which falls at the 95th percentile and is in the superior range.  Little or no variability was noted in subtest performance.  The patient did exceptionally well on measures of visual analysis and organization, visual reasoning and problem solving and visual judgment estimation variables.  We also administered the patient the clock drawing test which assesses visual constructional abilities and the patient also did quite well on this measure showing no indication of visual constructional deficits.   Clock Drawing Test  WNL  Working Memory Subtests Summary  Subtest Raw Score Scaled Score Percentile Rank Reference Group Scaled Score SEM  Digit Span 39 17 99 16 0.85  Arithmetic 20 15 95 15 1.04   The patient produced a working memory index score of 133 which falls at the 99th percentile and in the very superior range.  The patient did exceptionally well on measures of auditory encoding and multi processing variables.  Processing Speed Subtests Summary  Subtest Raw Score Scaled Score Percentile Rank Reference Group Scaled Score SEM  Symbol  Search 28 10 50 8 1.31  Coding 70 12 75 9 0.99   The patient produced a processing speed index score of 105 which falls at the 63rd percentile and in the average range.  There is no variability within subtest performance and the patient performed in the average range on measures assessing visual scanning, visual searching and overall speed of mental operations.    Index Score Summary  Index Sum of Scaled Scores Index Score Percentile Rank 95% Confidence Interval Qualitative Descriptor  Auditory Memory (AMI) 73 133 6  125-138 Very Superior  Visual Memory (VMI) 93 121 97 115-126 Superior  Visual Working Memory (VWMI) 40 133 55 123-138 Very Superior  Immediate Memory (IMI) 18 127 96 119-132 Superior  Delayed Memory (DMI) 35 133 99 124-138 Very Superior    The patient was then administered the Wechsler Memory Scale-IV.  As with the patient's auditory encoding measures from the Wechsler Adult Intelligence Scale the patient did exceptionally well on measures of visual working memory and visual encoding.  The patient produced a visual working memory index score of 133 which falls at the 99th percentile and in the very superior range.  Breaking the patient's memory functions between auditory versus visual memory suggested that both auditory and visual memory are doing exceptionally well without significant differences between the 2 although the patient's auditory memory did exceed and was greater than his visual memory.  The patient produced an auditory memory index score of 133 which falls at the 99th percentile and in the very superior range.  The patient's visual memory index score was 121 which falls at the 92nd percentile and in the superior range.  Both of these memory assessments were quite efficient and there was no indication of any objective findings of memory deficits.  We also brought the patient's memory components down between immediate versus delayed memory.  The patient produced an immediate memory index score of 127 which falls at the 96th percentile and in the superior range.  The patient did exceptionally well on his delayed memory index where he produced an index score of 133 and falling at the 99th percentile and in the very superior range.  The patient did very well both on free recall of previously learned information as well as cued and recognition formats.  Overall, the patient's memory was quite good and there were no objective findings of any deficits on any elements or aspects of memory and  learning assessed.   ABILITY-MEMORY ANALYSIS  Ability Score:  GAI: 118 Date of Testing:  WAIS-IV; WMS-IV 2019/08/01  Predicted Difference Method   Index Predicted WMS-IV Index Score Actual WMS-IV Index Score Difference Critical Value  Significant Difference Y/N Base Rate  Auditory Memory 110 133 -23 8.95 Y 4%  Visual Memory 111 121 -10 8.82 Y 20-25%  Visual Working Memory 112 133 -21 11.24 Y 3%  Immediate Memory 112 127 -15 10.35 Y 10%  Delayed Memory 110 133 -23 10.08 Y 3%  Statistical significance (critical value) at the .01 level.   To further analyze the patient's memory functions we utilize the abilities-memory analysis.  This analysis takes the patient's general abilities index score from the Wechsler Adult Intelligence Scale (GA I: 118) and produces a predicted score based off that measure of where the patient should be performing on various memory indices.  This predictive scores then compared against his actual achieved score on these measures.  On all memory components assessed the patient equal to or exceeded and in some  cases greatly exceeded predicted scores based on his general abilities index score.  There were no indications of any specific memory deficits relative to premorbid estimations and historical functioning or predicted levels based on longstanding "hold test" from his WAIS-IV performance  Summary of Results:   Overall, the results of the patient's objective neuropsychological assessment showed that he is performing in the high average to superior range of global cognitive performance with objective findings of cognitive deficits with the exception of one measure related to his general fund of information.  There was no pattern of consistent cognitive deficits in any cognitive domain assessed.  He did very well on measures of visual spatial and perceptual reasoning and problem solving, visual and auditory encoding, auditory and verbal memory both short-term,  intermediate and delayed functions, and performed within normal limits on measures of verbal comprehension and overall information processing speed.  Impression/Diagnosis:   The results of the current objective neuropsychological assessment in conjunction with the patient's subjective reports of 3 to 4 years of reduced memory functions without observed progression are not consistent with any progressive degenerative processes.  The patient did exceptionally well across the board on a wide range of neuropsychological variables including the 1 domain that he describes as having cognitive deficits related to memory functions.  There are no indications of attentional deficits, various cognitive deficits or memory deficits.  While the patient acknowledges subjective symptoms of memory difficulties it is possible that there are some functional issues that could be playing a role in his day-to-day functioning such as sleep disturbance although the patient reports that he wakes up feeling well rested in the morning.  The patient denies any depressive or anxiety based symptoms and reports only moderate intake of alcohol each day (2 drinks).  The patient reports that he is having some tremors in his hand with his right hand tremor greater than left hand.  The patient's brother has been diagnosed with essential tremor.  It is possible that we are very early in a process that the patient's subjective symptoms are more sensitive to identify and then objective neuropsychological measures.  If the patient experiences any progression to his difficulties it would be warranted to do repeat testing.  We have a very good baseline of current cognitive functioning to compare with if future assessments are needed.  Diagnosis:    Axis I: Subjective memory complaints   Ilean Skill, Psy.D. Neuropsychologist

## 2019-08-09 ENCOUNTER — Other Ambulatory Visit: Payer: Self-pay

## 2019-08-09 ENCOUNTER — Encounter: Payer: Self-pay | Admitting: Psychology

## 2019-08-09 ENCOUNTER — Encounter: Payer: 59 | Admitting: Psychology

## 2019-08-09 DIAGNOSIS — R4189 Other symptoms and signs involving cognitive functions and awareness: Secondary | ICD-10-CM | POA: Diagnosis not present

## 2019-08-09 NOTE — Progress Notes (Signed)
Today I provided feedback regarding the results of the recent neuropsychological evaluation.  The complete report can be found in the patient's EMR dated 08/05/2019.  Today's visit was an in person visit that was conducted in my outpatient clinic office with myself and the patient present.  We reviewed the results of the evaluation and went over recommendations going forward including consideration of looking at functional issues that may be playing a role in the patient's subjective memory difficulties.  However, the patient did quite well and there was no objective findings consistent with any type of degenerative process going on.  Below you will find the summary of these results from that formal evaluation.  Summary of Results:                        Overall, the results of the patient's objective neuropsychological assessment showed that he is performing in the high average to superior range of global cognitive performance with objective findings of cognitive deficits with the exception of one measure related to his general fund of information.  There was no pattern of consistent cognitive deficits in any cognitive domain assessed.  He did very well on measures of visual spatial and perceptual reasoning and problem solving, visual and auditory encoding, auditory and verbal memory both short-term, intermediate and delayed functions, and performed within normal limits on measures of verbal comprehension and overall information processing speed.  Impression/Diagnosis:                     The results of the current objective neuropsychological assessment in conjunction with the patient's subjective reports of 3 to 4 years of reduced memory functions without observed progression are not consistent with any progressive degenerative processes.  The patient did exceptionally well across the board on a wide range of neuropsychological variables including the 1 domain that he describes as having cognitive deficits  related to memory functions.  There are no indications of attentional deficits, various cognitive deficits or memory deficits.  While the patient acknowledges subjective symptoms of memory difficulties it is possible that there are some functional issues that could be playing a role in his day-to-day functioning such as sleep disturbance although the patient reports that he wakes up feeling well rested in the morning.  The patient denies any depressive or anxiety based symptoms and reports only moderate intake of alcohol each day (2 drinks).  The patient reports that he is having some tremors in his hand with his right hand tremor greater than left hand.  The patient's brother has been diagnosed with essential tremor.  It is possible that we are very early in a process that the patient's subjective symptoms are more sensitive to identify and then objective neuropsychological measures.  If the patient experiences any progression to his difficulties it would be warranted to do repeat testing.  We have a very good baseline of current cognitive functioning to compare with if future assessments are needed.  Diagnosis:                               Axis I: Subjective memory complaints   Ilean Skill, Psy.D. Neuropsychologist

## 2019-08-22 ENCOUNTER — Ambulatory Visit: Payer: Self-pay | Admitting: Neurology

## 2019-08-24 ENCOUNTER — Ambulatory Visit: Payer: 59 | Admitting: Neurology

## 2019-08-24 ENCOUNTER — Other Ambulatory Visit: Payer: Self-pay

## 2019-08-24 ENCOUNTER — Encounter: Payer: Self-pay | Admitting: Neurology

## 2019-08-24 VITALS — BP 116/76 | HR 58 | Temp 97.5°F | Ht 70.0 in | Wt 161.0 lb

## 2019-08-24 DIAGNOSIS — R419 Unspecified symptoms and signs involving cognitive functions and awareness: Secondary | ICD-10-CM | POA: Diagnosis not present

## 2019-08-24 DIAGNOSIS — G250 Essential tremor: Secondary | ICD-10-CM | POA: Diagnosis not present

## 2019-08-24 MED ORDER — PROPRANOLOL HCL 20 MG PO TABS: 20.0000 mg | ORAL_TABLET | Freq: Every day | ORAL | 3 refills | Status: DC

## 2019-08-24 NOTE — Progress Notes (Signed)
Subjective:    [name] ID: John [name] is a 59 y.o. male.  HPI    Interim history:     John [name] is a 59 year old right-handed gentleman with an underlying medical history of hyperlipidemia, allergic rhinitis, bilateral shoulder surgeries, and overweight state, who presents for follow-up consultation of his hand tremors.  John [name] is unaccompanied today.  I last saw John [name] on 01/17/2019, at which time John [name] felt John propranolol was helpful for his tremor.  John [name] was using it sparingly and only if needed.  John [name] was worried about memory loss.  John [name] reported a family history of dementia.  I suggested we proceed with additional testing including blood work, MRI brain and neuropsychological testing.    Blood work from 05/19/2019 showed benign findings, including B12, folate, A1c, CMP, TSH, RPR and CBC with differential.  John [name] had a brain MRI with and without contrast on 02/22/2019 and I reviewed John results: IMPRESSION: Negative MRI head with contrast.  John [name] had neuropsychological evaluation with Dr. Tera Mater with testing on 07/25/2019 and a follow-up appointment on 08/09/2019, and I reviewed John note: << Impression/Diagnosis:                     John results of John current objective neuropsychological assessment in conjunction with John [name]'s subjective reports of 3 to 4 years of reduced memory functions without observed progression are not consistent with any progressive degenerative processes.  John [name] did exceptionally well across John board on a wide range of neuropsychological variables including John 1 domain that John [name] describes as having cognitive deficits related to memory functions.  There are no indications of attentional deficits, various cognitive deficits or memory deficits.   While John [name] acknowledges subjective symptoms of memory difficulties it is possible that there are some functional issues that could be playing a role in his day-to-day functioning such as sleep disturbance  although John [name] reports that John [name] wakes up feeling well rested in John morning.  John [name] denies any depressive or anxiety based symptoms and reports only moderate intake of alcohol each day (2 drinks).  John [name] reports that John [name] is having some tremors in his hand with his right hand tremor greater than left hand.  John [name]'s brother has been diagnosed with essential tremor.   It is possible that we are very early in a process that John [name]'s subjective symptoms are more sensitive to identify and then objective neuropsychological measures.  If John [name] experiences any progression to his difficulties it would be warranted to do repeat testing.  We have a very good baseline of current cognitive functioning to compare with if future assessments are needed.   Diagnosis:                                Axis I: Subjective memory complaints>>  Today, 08/24/2019: John [name] reports no new symptoms.  John [name] feels that John tremor is fairly stable.  John [name] takes only half a pill as needed, maybe twice a week or so.  John [name] has not felt John need to take it every day.  John [name] feels that John memory is stable, still feels that John [name] forgets things.  John [name] tries to hydrate well with water.  John [name] has not had a yearly physical.  John [name] is encouraged to seek a yearly physical and yearly blood work with his PCP.   Previously:    I first met John [name] on 07/27/2018  at John request of his primary care physician, at which time John [name] reported an approximately 5-year history of bilateral hand tremors, right a little more than left.  On examination, John [name] had findings in keeping with essential tremor, no parkinsonism.  John [name] was advised to start a trial of propranolol for symptomatic treatment.  John [name] had also concerns about his memory.    07/27/2018: (John [name]) reports a bilateral hand tremor for John past few years, about 5 years, affecting both hands, perhaps John right more than left but John [name] is right-handed. John [name] is not impaired in his day-to-day activities but finds John tremor  socially embarrassing. It has progressed with time. Tremor is typically with certain activity or with holding something such as a cup or a paper. No resting tremor, no lower extremity tremor, no recent falls, no balance issues.  There is no obvious family history of tremors, no family history of Parkinson's disease. John [name] does have a family history of dementia affecting his father and John [name] is worried about memory loss. John [name] has been more forgetful. John [name] is family especially his wife has not voiced any concern about his memory function John [name] feels that John [name] may have a problem. I reviewed your office note from 06/16/2018, which you kindly included. John [name] quit smoking in 2001, and drinks alcohol in John form of beer or a mixed drink, 2-3 per night on average. John [name] drinks caffeine, 2 servings per day on average. John [name] is married and lives with his wife, John [name] works from home, they have 3 children. John [name] does not always hydrate very well John [name] admits.   His Past Medical History Is Significant For: Past Medical History:  Diagnosis Date  . Arthritis    OA right shoulder  . Cancer (HCC)    BCC of right forearm  . Hyperlipidemia   . Shoulder impingement    right    His Past Surgical History Is Significant For: Past Surgical History:  Procedure Laterality Date  . HERNIA REPAIR Right    IHR  . KNEE ARTHROSCOPY Left   . NOSE SURGERY    . SHOULDER ARTHROSCOPY WITH ROTATOR CUFF REPAIR Right 08/16/2015   Procedure: SHOULDER ARTHROSCOPY WITH ROTATOR CUFF REPAIR;  Surgeon: [name], MD;  Location: [facility];  Service: Orthopedics;  Laterality: Right;  . SHOULDER ARTHROSCOPY WITH SUBACROMIAL DECOMPRESSION Right 08/16/2015   Procedure: SHOULDER ARTHROSCOPY WITH SUBACROMIAL DECOMPRESSION LABRAL DEBRIDEMENT;  Surgeon: [name], MD;  Location: [facility];  Service: Orthopedics;  Laterality: Right;  . SHOULDER SURGERY Right   . SKIN CANCER EXCISION      His Family History Is Significant For: No  family history on file.  His Social History Is Significant For: Social History   Socioeconomic History  . Marital status: Married    Spouse name: Not on file  . Number of children: Not on file  . Years of education: Not on file  . Highest education level: Not on file  Occupational History  . Not on file  Tobacco Use  . Smoking status: Never Smoker  . Smokeless tobacco: Never Used  Substance and Sexual Activity  . Alcohol use: Yes    Comment: drinks 1-2 drinks/night  . Drug use: No  . Sexual activity: Not on file  Other Topics Concern  . Not on file  Social History Narrative  . Not on file   Social Determinants of Health   Financial Resource Strain:   . Difficulty of Paying Living Expenses: Not on file  Food Insecurity:   . Worried About Charity fundraiser in John Last Year: Not on file  . Ran Out of Food in John Last Year: Not on file  Transportation Needs:   . Lack of Transportation (Medical): Not on file  . Lack of Transportation (Non-Medical): Not on file  Physical Activity:   . Days of Exercise per Week: Not on file  . Minutes of Exercise per Session: Not on file  Stress:   . Feeling of Stress : Not on file  Social Connections:   . Frequency of Communication with Friends and Family: Not on file  . Frequency of Social Gatherings with Friends and Family: Not on file  . Attends Religious Services: Not on file  . Active Member of Clubs or Organizations: Not on file  . Attends Archivist Meetings: Not on file  . Marital Status: Not on file    His Allergies Are:  No Known Allergies:   His Current Medications Are:  Outpatient Encounter Medications as of 08/24/2019  Medication Sig  . propranolol (INDERAL) 20 MG tablet 1/2 pill to 1 pill up to 3 times a day as needed for tremor.  . [DISCONTINUED] aspirin EC 81 MG tablet Take 81 mg by mouth daily.  . [DISCONTINUED] Coenzyme Q10-Fish Oil-Vit E (CO-Q 10 OMEGA-3 FISH OIL) CAPS Take by mouth.  . [DISCONTINUED]  Omega-3 Fatty Acids (FISH OIL) 1200 MG CAPS Take 1,200 mg by mouth daily.  . [DISCONTINUED] Red Yeast Rice 600 MG TABS Take by mouth.   No facility-administered encounter medications on file as of 08/24/2019.  :  Review of Systems:  Out of a complete 14 point review of systems, all are reviewed and negative with John exception of these symptoms as listed below: Review of Systems  Neurological:       Pt in room 2 alone. Reports tremors are unchanged. John [name] does not take his propranolol daily but only when John tremors are at their worst. John [name] completed his neuropsychiatric testing with Dr. Ilean Skill. Crissie Sickles    Objective:  Neurological Exam  Physical Exam Physical Examination:   Vitals:   08/24/19 1040  BP: 116/76  Pulse: (!) 58  Temp: (!) 97.5 F (36.4 C)    General Examination: John [name] is a very pleasant 59 y.o. male in no acute distress. John [name] appears well-developed and well-nourished and adequately groomed.   HEENT:Normocephalic, atraumatic, pupils are equal, round and reactive to light and accommodation. Extraocular tracking is good without limitation to gaze excursion or nystagmus noted. Normal smooth pursuit is noted. Hearing is grossly intact. Face is symmetric with normal facial animation and normal facial sensation. Speech is clear with no dysarthria noted. There is no hypophonia. There is no lip, neck/head, jaw or voice tremor. Neck is supple with full range of passive and active motion. Oropharynx exam reveals: mild mouth dryness, adequatedental hygiene. Tongue protrudes centrally and palate elevates symmetrically.  Chest:Clear to auscultation without wheezing, rhonchi or crackles noted.  Heart:S1+S2+0, regular and normal without murmurs, rubs or gallops noted.   Abdomen:Soft, non-tender and non-distended with normal bowel sounds appreciated on auscultation.  Extremities:There isnopitting edema in John distal lower extremities bilaterally. Pedal pulses are  intact.  Skin: Warm and dry without trophic changes noted.  Musculoskeletal: exam reveals no obvious joint deformities, tenderness or joint swelling or erythema.   Neurologically:  Mental status: John [name] is awake, alert and oriented in all 4 spheres.Hisimmediate and remote memory, attention, language skills and fund of knowledge are  appropriate. There is no evidence of aphasia, agnosia, apraxia or anomia. Speech is clear with normal prosody and enunciation. Thought process is linear. Mood is normaland affect is normal.  Cranial nerves II - XII are as described above under HEENT exam. In addition: shoulder shrug is normal with equal shoulder height noted.  On 01/17/2019: MMSE: 27/30 (John [name] missed season and doctor's name, and 1 point on John physical task of taking paper in his hand).  Motor exam: Normal bulk, strength and tone is noted. There is no restingtremor.   (On1/01/2019: on Archimedes spiral drawing John [name] has minimal trembling with John left hand, no significant trembling noted with his right hand. Handwriting with his right hand is legible, slightly on John smaller side, not particularly tremulous.)  John [name] has no significant upper extremity postural tremor or action tremor today (took propranolol today).   Romberg is negative. Fine motor skills and coordination: grossly intact.  Cerebellar testing: No dysmetria or intention tremor. There is no truncal or gait ataxia.  Sensory exam: intact to light touch.  Gait, station and balance:Hestands easily. No veering to one side is noted. No leaning to one side is noted. Posture is age-appropriate and stance is narrow based. Gait showsnormalstride length and normalpace. No problems turning are noted. Tandem walk is unremarkable.   Assessmentand Plan:   In summary,Layten S McLemoreis a very pleasant 38 year oldmalewith an underlying medical history of hyperlipidemia, allergic rhinitis, bilateral shoulder surgeries, and overweight  state, whopresents for Follow-up consultation of his essential tremor for which John [name] has been on low-dose propranolol as needed. John [name] benefits from it.  John [name] takes half a pill as needed, typically a couple times a week.  I renewed his prescription.  John [name] can take 20 mg strength up to 1 pill daily if needed.  John [name] has had memory complaints.  John [name] reports a family history of dementia or memory loss affecting his father in his 33s.  We did some blood work, an MRI of John brain in August 2020 which was benign and John [name] had neuropsychological evaluation in January 2021 which was also reassuring and benign.  John [name] is reassured in that regard.  For now, I suggested a routine checkup in about a year for his tremor.  If John [name] feels John [name] needs a sooner appointment, John [name] is encouraged to call. John [name] is encouraged to maintain a healthy lifestyle including regular exercise, good hydration, enough sleep, and healthy/balanced nutrition. John [name] can follow-up with John nurse practitioner routinely. John [name] is encouraged to schedule a yearly physical including blood work with his primary care physician as this is important as part of general medical checkup and prevention Of chronic illnesses. I answered all his questions today and John [name] was in agreement. I spent 30 minutes in total face-to-face and in reviewing records during pre-charting, more than 50% of which was spent in counseling and coordination of care, reviewing test results, reviewing medication and discussing or reviewing John diagnosis of ET, cognitive complaints, John prognosis and treatment options. Pertinent laboratory and imaging test results that were available during this visit with John [name] were reviewed by me and considered in my medical decision making (see chart for details).

## 2019-08-24 NOTE — Patient Instructions (Addendum)
Your tremor is stable.  Evaluation of your memory was benign.  You can continue with the Propranolol for tremor control. I have renewed your prescription, you can take up to 1 pill daily with the new prescription.  Please follow-up in about a year to see the nurse practitioner.

## 2019-10-20 ENCOUNTER — Ambulatory Visit: Payer: 59 | Attending: Internal Medicine

## 2019-10-20 DIAGNOSIS — Z23 Encounter for immunization: Secondary | ICD-10-CM

## 2019-10-20 NOTE — Progress Notes (Signed)
   Covid-19 Vaccination Clinic  Name:  John [name]    MRN: [id number] DOB: 28-Mar-1961  10/20/2019  Mr. John [name] was observed post Covid-19 immunization for 15 minutes without incident. He was provided with Vaccine Information Sheet and instruction to access the V-Safe system.   Mr. John [name] was instructed to call 911 with any severe reactions post vaccine: Marland Kitchen Difficulty breathing  . Swelling of face and throat  . A fast heartbeat  . A bad rash all over body  . Dizziness and weakness   Immunizations Administered    Name Date Dose VIS Date Route   Pfizer COVID-19 Vaccine 10/20/2019 11:52 AM 0.3 mL 07/01/2019 Intramuscular   Manufacturer: [place]   Lot: [id number]   [place]: [id number]

## 2019-11-14 ENCOUNTER — Ambulatory Visit: Payer: 59 | Attending: Internal Medicine

## 2019-11-14 DIAGNOSIS — Z23 Encounter for immunization: Secondary | ICD-10-CM

## 2019-11-14 NOTE — Progress Notes (Signed)
   Covid-19 Vaccination Clinic  Name:  EHITAN GRIEGO    MRN: [id number] DOB: 18-Apr-1961  11/14/2019  Mr. Branson was observed post Covid-19 immunization for 15 minutes without incident. He was provided with Vaccine Information Sheet and instruction to access the V-Safe system.   Mr. Kneece was instructed to call 911 with any severe reactions post vaccine: Marland Kitchen Difficulty breathing  . Swelling of face and throat  . A fast heartbeat  . A bad rash all over body  . Dizziness and weakness   Immunizations Administered    Name Date Dose VIS Date Route   Pfizer COVID-19 Vaccine 11/14/2019 10:57 AM 0.3 mL 09/14/2018 Intramuscular   Manufacturer: [place]   Lot: [id number]   [place]: [id number]

## 2019-11-21 ENCOUNTER — Ambulatory Visit: Payer: 59 | Admitting: Psychology

## 2020-08-27 ENCOUNTER — Telehealth (INDEPENDENT_AMBULATORY_CARE_PROVIDER_SITE_OTHER): Payer: 59 | Admitting: Adult Health

## 2020-08-27 ENCOUNTER — Other Ambulatory Visit: Payer: Self-pay | Admitting: *Deleted

## 2020-08-27 ENCOUNTER — Encounter: Payer: Self-pay | Admitting: Adult Health

## 2020-08-27 DIAGNOSIS — G250 Essential tremor: Secondary | ICD-10-CM

## 2020-08-27 MED ORDER — PROPRANOLOL HCL 20 MG PO TABS: 20.0000 mg | ORAL_TABLET | Freq: Every day | ORAL | 3 refills | Status: DC

## 2020-08-27 NOTE — Progress Notes (Addendum)
PATIENT: John [name] DOB: 1960/10/30  REASON FOR VISIT: follow up HISTORY FROM: patient  Virtual Visit via Video Note  I connected with [name] on 08/27/20 at 10:30 AM EST by a video enabled telemedicine application located remotely at [facility] and verified that I am speaking with the correct person using two identifiers who was located at their own home.   I discussed the limitations of evaluation and management by telemedicine and the availability of in person appointments. The patient expressed understanding and agreed to proceed.   PATIENT: John [name] DOB: 01-08-1961  REASON FOR VISIT: follow up HISTORY FROM: patient  HISTORY OF PRESENT ILLNESS: Today 08/27/20: John [name] is a 60 year old male with a history of essential tremor.  He returns today for follow-up.  He remains on propranolol.  States that he only takes it 1-2 times a week.  He feels that overall his tremors have remained relatively stable.  He also feels that his memory has remained stable.  He denies any new symptoms.  He returns today for evaluation.  HISTORY 08/24/2019: He reports no new symptoms.  He feels that the tremor is fairly stable.  He takes only half a pill as needed, maybe twice a week or so.  He has not felt the need to take it every day.  He feels that the memory is stable, still feels that he forgets things.  He tries to hydrate well with water.  He has not had a yearly physical.  He is encouraged to seek a yearly physical and yearly blood work with his PCP.  REVIEW OF SYSTEMS: Out of a complete 14 system review of symptoms, the patient complains only of the following symptoms, and all other reviewed systems are negative.  See HPI  ALLERGIES: No Known Allergies  HOME MEDICATIONS: Outpatient Medications Prior to Visit  Medication Sig Dispense Refill  . propranolol (INDERAL) 20 MG tablet Take 1 tablet (20 mg total) by mouth daily. 90 tablet 3   No  facility-administered medications prior to visit.    PAST MEDICAL HISTORY: Past Medical History:  Diagnosis Date  . Arthritis    OA right shoulder  . Cancer (HCC)    BCC of right forearm  . Hyperlipidemia   . Shoulder impingement    right    PAST SURGICAL HISTORY: Past Surgical History:  Procedure Laterality Date  . HERNIA REPAIR Right    IHR  . KNEE ARTHROSCOPY Left   . NOSE SURGERY    . SHOULDER ARTHROSCOPY WITH ROTATOR CUFF REPAIR Right 08/16/2015   Procedure: SHOULDER ARTHROSCOPY WITH ROTATOR CUFF REPAIR;  Surgeon: [name], MD;  Location: [facility];  Service: Orthopedics;  Laterality: Right;  . SHOULDER ARTHROSCOPY WITH SUBACROMIAL DECOMPRESSION Right 08/16/2015   Procedure: SHOULDER ARTHROSCOPY WITH SUBACROMIAL DECOMPRESSION LABRAL DEBRIDEMENT;  Surgeon: [name], MD;  Location: [facility];  Service: Orthopedics;  Laterality: Right;  . SHOULDER SURGERY Right   . SKIN CANCER EXCISION      FAMILY HISTORY: No family history on file.  SOCIAL HISTORY: Social History   Socioeconomic History  . Marital status: Married    Spouse name: Not on file  . Number of children: Not on file  . Years of education: Not on file  . Highest education level: Not on file  Occupational History  . Not on file  Tobacco Use  . Smoking status: Never Smoker  . Smokeless tobacco: Never Used  Substance  and Sexual Activity  . Alcohol use: Yes    Comment: drinks 1-2 drinks/night  . Drug use: No  . Sexual activity: Not on file  Other Topics Concern  . Not on file  Social History Narrative  . Not on file   Social Determinants of Health   Financial Resource Strain: Not on file  Food Insecurity: Not on file  Transportation Needs: Not on file  Physical Activity: Not on file  Stress: Not on file  Social Connections: Not on file  Intimate Partner Violence: Not on file      PHYSICAL EXAM Generalized: Well developed, in no acute distress    Neurological examination  Mentation: Alert oriented to time, place, history taking. Follows all commands speech and language fluent Cranial nerve II-XII:Extraocular movements were full. Facial symmetry noted. uvula tongue midline. Head turning and shoulder shrug  were normal and symmetric. Motor: Good strength throughout subjectively per patient Sensory: Sensory testing is intact to soft touch on all 4 extremities subjectively per patient Coordination: Cerebellar testing reveals good finger-nose-finger  Gait and station: Patient is able to stand from a seated position. gait is normal.  Reflexes: UTA  DIAGNOSTIC DATA (LABS, IMAGING, TESTING) - I reviewed patient records, labs, notes, testing and imaging myself where available.  Lab Results  Component Value Date   WBC 4.0 01/17/2019   HGB 13.2 01/17/2019   HCT 39.8 01/17/2019   MCV 84 01/17/2019   PLT 190 01/17/2019      Component Value Date/Time   NA 137 01/17/2019 0913   K 4.3 01/17/2019 0913   CL 101 01/17/2019 0913   CO2 24 01/17/2019 0913   GLUCOSE 104 (H) 01/17/2019 0913   BUN 13 01/17/2019 0913   CREATININE 1.01 01/17/2019 0913   CALCIUM 9.2 01/17/2019 0913   PROT 6.5 01/17/2019 0913   ALBUMIN 4.4 01/17/2019 0913   AST 23 01/17/2019 0913   ALT 20 01/17/2019 0913   ALKPHOS 58 01/17/2019 0913   BILITOT 0.5 01/17/2019 0913   GFRNONAA 82 01/17/2019 0913   GFRAA 94 01/17/2019 0913   No results found for: CHOL, HDL, LDLCALC, LDLDIRECT, TRIG, CHOLHDL Lab Results  Component Value Date   HGBA1C 5.5 01/17/2019   Lab Results  Component Value Date   VITAMINB12 497 01/17/2019   Lab Results  Component Value Date   TSH 1.530 01/17/2019      ASSESSMENT AND PLAN 60 y.o. year old adult  has a past medical history of Arthritis, Cancer ([place]), Hyperlipidemia, and Shoulder impingement. here with:  1.  Essential tremor  --Continue propranolol 20 mg daily if needed --Advised if symptoms worsen or he develops new symptoms  he should let us know --Follow-up in 1 year or sooner if needed  I spent 20 minutes of face-to-face and non-face-to-face time with patient.  This included previsit chart review, lab review, study review, order entry, electronic health record documentation, patient education.    Ward Givens, MSN, NP-C 08/27/2020, 10:38 AM [facility] [address], [place], [place] [address] [phone]  I reviewed the above note and documentation by the Nurse Practitioner and agree with the history, exam, assessment and plan as outlined above. I was available for consultation. Star Age, MD, PhD [facility])

## 2020-08-27 NOTE — Telephone Encounter (Signed)
Ok to refill 

## 2021-03-20 ENCOUNTER — Encounter: Payer: Self-pay | Admitting: Adult Health

## 2021-03-21 NOTE — Telephone Encounter (Signed)
I am sorry I typically do not prescribe this drug.  It will need to come from PCP

## 2021-05-07 ENCOUNTER — Other Ambulatory Visit (HOSPITAL_BASED_OUTPATIENT_CLINIC_OR_DEPARTMENT_OTHER): Payer: Self-pay | Admitting: Family Medicine

## 2021-05-07 DIAGNOSIS — Z8249 Family history of ischemic heart disease and other diseases of the circulatory system: Secondary | ICD-10-CM

## 2021-05-14 ENCOUNTER — Other Ambulatory Visit (HOSPITAL_BASED_OUTPATIENT_CLINIC_OR_DEPARTMENT_OTHER): Payer: Self-pay | Admitting: Family Medicine

## 2021-05-14 ENCOUNTER — Ambulatory Visit (HOSPITAL_BASED_OUTPATIENT_CLINIC_OR_DEPARTMENT_OTHER)
Admission: RE | Admit: 2021-05-14 | Discharge: 2021-05-14 | Disposition: A | Discharge status: Home / Self Care | Payer: 59 | Source: Ambulatory Visit | Attending: Family Medicine | Admitting: Family Medicine

## 2021-05-14 ENCOUNTER — Other Ambulatory Visit: Payer: Self-pay

## 2021-05-14 DIAGNOSIS — R1011 Right upper quadrant pain: Secondary | ICD-10-CM

## 2021-05-14 DIAGNOSIS — Z8249 Family history of ischemic heart disease and other diseases of the circulatory system: Secondary | ICD-10-CM | POA: Insufficient documentation

## 2021-08-06 ENCOUNTER — Other Ambulatory Visit: Payer: Self-pay | Admitting: Adult Health

## 2021-09-03 ENCOUNTER — Telehealth (INDEPENDENT_AMBULATORY_CARE_PROVIDER_SITE_OTHER): Payer: Self-pay | Admitting: Adult Health

## 2021-09-03 DIAGNOSIS — G250 Essential tremor: Secondary | ICD-10-CM

## 2021-09-03 NOTE — Progress Notes (Signed)
PATIENT: John [name] DOB: March 12, 1961  REASON FOR VISIT: follow up HISTORY FROM: patient  Virtual Visit via Video Note  I connected with John [name] on 09/03/21 at  1:15 PM EST by a video enabled telemedicine application located remotely at [facility] and verified that I am speaking with the correct person using two identifiers who was located at their own home.   I discussed the limitations of evaluation and management by telemedicine and the availability of in person appointments. The patient expressed understanding and agreed to proceed.   PATIENT: John [name] DOB: [birth year]  REASON FOR VISIT: follow up HISTORY FROM: patient  HISTORY OF PRESENT ILLNESS: Today 09/03/21:  John [name] is a 61 year old male with a history of essential tremor.  He returns today for follow-up.  He remains on propranolol taking 10 mg daily.  He states that he used to take it as needed but now he takes it every morning.  He feels that his tremor has remained stable.  Continues to be primarily isolated to the hands.  Does not interfere with his ADLs.  He returns today for an evaluation.  08/27/20: John [name] is a 61 year old male with a history of essential tremor.  He returns today for follow-up.  He remains on propranolol.  States that he only takes it 1-2 times a week.  He feels that overall his tremors have remained relatively stable.  He also feels that his memory has remained stable.  He denies any new symptoms.  He returns today for evaluation.  HISTORY 08/24/2019: He reports no new symptoms.  He feels that the tremor is fairly stable.  He takes only half a pill as needed, maybe twice a week or so.  He has not felt the need to take it every day.  He feels that the memory is stable, still feels that he forgets things.  He tries to hydrate well with water.  He has not had a yearly physical.  He is encouraged to seek a yearly physical and yearly blood work with his  PCP.  REVIEW OF SYSTEMS: Out of a complete 14 system review of symptoms, the patient complains only of the following symptoms, and all other reviewed systems are negative.  See HPI  ALLERGIES: No Known Allergies  HOME MEDICATIONS: Outpatient Medications Prior to Visit  Medication Sig Dispense Refill   propranolol (INDERAL) 20 MG tablet Take 1 tablet (20 mg total) by mouth daily. 90 tablet 0   No facility-administered medications prior to visit.    PAST MEDICAL HISTORY: Past Medical History:  Diagnosis Date   Arthritis    OA right shoulder   Cancer ([place])    BCC of right forearm   Hyperlipidemia    Shoulder impingement    right    PAST SURGICAL HISTORY: Past Surgical History:  Procedure Laterality Date   HERNIA REPAIR Right    IHR   KNEE ARTHROSCOPY Left    NOSE SURGERY     SHOULDER ARTHROSCOPY WITH ROTATOR CUFF REPAIR Right 08/16/2015   Procedure: SHOULDER ARTHROSCOPY WITH ROTATOR CUFF REPAIR;  Surgeon: [name], MD;  Location: [facility];  Service: Orthopedics;  Laterality: Right;   SHOULDER ARTHROSCOPY WITH SUBACROMIAL DECOMPRESSION Right 08/16/2015   Procedure: SHOULDER ARTHROSCOPY WITH SUBACROMIAL DECOMPRESSION LABRAL DEBRIDEMENT;  Surgeon: [name], MD;  Location: [facility];  Service: Orthopedics;  Laterality: Right;   SHOULDER SURGERY Right    SKIN CANCER EXCISION  FAMILY HISTORY: No family history on file.  SOCIAL HISTORY: Social History   Socioeconomic History   Marital status: Married    Spouse name: Not on file   Number of children: Not on file   Years of education: Not on file   Highest education level: Not on file  Occupational History   Not on file  Tobacco Use   Smoking status: Never   Smokeless tobacco: Never  Substance and Sexual Activity   Alcohol use: Yes    Comment: drinks 1-2 drinks/night   Drug use: No   Sexual activity: Not on file  Other Topics Concern   Not on file  Social History  Narrative   Not on file   Social Determinants of Health   Financial Resource Strain: Not on file  Food Insecurity: Not on file  Transportation Needs: Not on file  Physical Activity: Not on file  Stress: Not on file  Social Connections: Not on file  Intimate Partner Violence: Not on file      PHYSICAL EXAM Generalized: Well developed, in no acute distress   Neurological examination  Mentation: Alert oriented to time, place, history taking. Follows all commands speech and language fluent Cranial nerve II-XII:Extraocular movements were full. Facial symmetry noted. uvula tongue midline. Head turning and shoulder shrug  were normal and symmetric. Motor: Good strength throughout subjectively per patient Sensory: Sensory testing is intact to soft touch on all 4 extremities subjectively per patient Coordination: Cerebellar testing reveals good finger-nose-finger  Reflexes: UTA  DIAGNOSTIC DATA (LABS, IMAGING, TESTING) - I reviewed patient records, labs, notes, testing and imaging myself where available.  Lab Results  Component Value Date   WBC 4.0 01/17/2019   HGB 13.2 01/17/2019   HCT 39.8 01/17/2019   MCV 84 01/17/2019   PLT 190 01/17/2019      Component Value Date/Time   NA 137 01/17/2019 0913   K 4.3 01/17/2019 0913   CL 101 01/17/2019 0913   CO2 24 01/17/2019 0913   GLUCOSE 104 (H) 01/17/2019 0913   BUN 13 01/17/2019 0913   CREATININE 1.01 01/17/2019 0913   CALCIUM 9.2 01/17/2019 0913   PROT 6.5 01/17/2019 0913   ALBUMIN 4.4 01/17/2019 0913   AST 23 01/17/2019 0913   ALT 20 01/17/2019 0913   ALKPHOS 58 01/17/2019 0913   BILITOT 0.5 01/17/2019 0913   GFRNONAA 82 01/17/2019 0913   GFRAA 94 01/17/2019 0913   No results found for: CHOL, HDL, LDLCALC, LDLDIRECT, TRIG, CHOLHDL Lab Results  Component Value Date   HGBA1C 5.5 01/17/2019   Lab Results  Component Value Date   VITAMINB12 497 01/17/2019   Lab Results  Component Value Date   TSH 1.530 01/17/2019       ASSESSMENT AND PLAN 61 y.o. year old adult  has a past medical history of Arthritis, Cancer ([place]), Hyperlipidemia, and Shoulder impingement. here with:  1.  Essential tremor  --Continue propranolol 10 mg daily if needed --Advised if symptoms worsen or he develops new symptoms he should let us know -- Overall the patient has remained stable for several visits he will be returned to his PCP.  Future refills should come from his PCP.  He can certainly follow-up with our office for new or worsening symptoms.      Ward Givens, MSN, NP-C 09/03/2021, 1:19 PM [facility] [address], [place], [place] [address] [phone]

## 2021-12-23 ENCOUNTER — Other Ambulatory Visit: Payer: Self-pay | Admitting: Adult Health

## 2021-12-25 NOTE — Telephone Encounter (Signed)
Called pt and LVM (ok per DPR) asking for call back or mychart message back to let us know what dose and how he takes his Propranolol before we send a refill. Left office number in message.

## 2022-03-07 ENCOUNTER — Other Ambulatory Visit: Payer: Self-pay | Admitting: Adult Health

## 2022-04-15 NOTE — Progress Notes (Unsigned)
John [name]: [phone] Subjective:   [name], am serving as a scribe for Dr. Hulan Saas.   I'm seeing this patient by the request  of:  John Melter, MD  CC: Neck pain and back pain  [id number]  John [name] is a 61 y.o. adult coming in with complaint of neck pain. Patient states that for past year he has had pain in neck with rotation. Patient will use IBU prn. Denies any radiating symptoms. Pain started statin at same time neck pain started.        Past Medical History:  Diagnosis Date   Arthritis    OA right shoulder   Cancer ([place])    BCC of right forearm   Hyperlipidemia    Shoulder impingement    right   Past Surgical History:  Procedure Laterality Date   HERNIA REPAIR Right    IHR   KNEE ARTHROSCOPY Left    NOSE SURGERY     SHOULDER ARTHROSCOPY WITH ROTATOR CUFF REPAIR Right 08/16/2015   Procedure: SHOULDER ARTHROSCOPY WITH ROTATOR CUFF REPAIR;  Surgeon: John Lights, MD;  Location: [facility];  Service: Orthopedics;  Laterality: Right;   SHOULDER ARTHROSCOPY WITH SUBACROMIAL DECOMPRESSION Right 08/16/2015   Procedure: SHOULDER ARTHROSCOPY WITH SUBACROMIAL DECOMPRESSION LABRAL DEBRIDEMENT;  Surgeon: John Lights, MD;  Location: [facility];  Service: Orthopedics;  Laterality: Right;   SHOULDER SURGERY Right    SKIN CANCER EXCISION     Social History   Socioeconomic History   Marital status: Married    Spouse name: Not on file   Number of children: Not on file   Years of education: Not on file   Highest education level: Not on file  Occupational History   Not on file  Tobacco Use   Smoking status: Never   Smokeless tobacco: Never  Substance and Sexual Activity   Alcohol use: Yes    Comment: drinks 1-2 drinks/night   Drug use: No   Sexual activity: Not on file  Other Topics Concern   Not on file  Social History Narrative    Not on file   Social Determinants of Health   Financial Resource Strain: Not on file  Food Insecurity: Not on file  Transportation Needs: Not on file  Physical Activity: Not on file  Stress: Not on file  Social Connections: Not on file   No Known Allergies No family history on file.   Current Outpatient Medications (Cardiovascular):    propranolol (INDERAL) 20 MG tablet, Take 1/2 tablet by mouth daily.   rosuvastatin (CRESTOR) 10 MG tablet, Take 10 mg by mouth daily.   Current Outpatient Medications (Analgesics):    meloxicam (MOBIC) 15 MG tablet, Take 1 tablet (15 mg total) by mouth daily.   Current Outpatient Medications (Other):    gabapentin (NEURONTIN) 100 MG capsule, Take 2 capsules (200 mg total) by mouth at bedtime.    Reviewed prior external information including notes and imaging from  primary care provider As well as notes that were available from care everywhere and other healthcare systems.  Patient did have a fall and was in the emergency room 2 months ago.  Seem to be secondary to alcohol intoxication.  Past medical history, social, surgical and family history all reviewed in electronic medical record.  No pertanent information unless stated regarding to the chief complaint.   Review of Systems:  No headache, visual changes,  nausea, vomiting, diarrhea, constipation, dizziness, abdominal pain, skin rash, fevers, chills, night sweats, weight loss, swollen lymph nodes, joint swelling, chest pain, shortness of breath, mood changes. POSITIVE muscle aches, body aches  Objective  Blood pressure 122/82, pulse (!) 48, height '5\' 10"'$  (1.778 m), weight 171 lb (77.6 kg), SpO2 99 %.   General: No apparent distress alert and oriented x3 mood and affect normal, dressed appropriately.  HEENT: Pupils equal, extraocular movements intact  Respiratory: Patient's speak in full sentences and does not appear short of breath  Cardiovascular: No lower extremity edema, non tender,  no erythema  Neck exam does have some loss of lordosis.  Poor posture noted.  Tender to palpation in the paraspinal musculature mostly in the parascapular region bilaterally.  Negative Spurling's of the neck.  Lacks last 10 degrees of extension.   Osteopathic findings C2 flexed rotated and side bent right C4 flexed rotated and side bent left C6 flexed rotated and side bent left T3 extended rotated and side bent right inhaled third rib T6 extended rotated and side bent left    Impression and Recommendations:    The above documentation has been reviewed and is accurate and complete John Pulley, DO

## 2022-04-16 ENCOUNTER — Ambulatory Visit (INDEPENDENT_AMBULATORY_CARE_PROVIDER_SITE_OTHER): Payer: 59 | Admitting: Family Medicine

## 2022-04-16 ENCOUNTER — Ambulatory Visit (INDEPENDENT_AMBULATORY_CARE_PROVIDER_SITE_OTHER): Payer: 59

## 2022-04-16 ENCOUNTER — Encounter: Payer: Self-pay | Admitting: Family Medicine

## 2022-04-16 VITALS — BP 122/82 | HR 48 | Ht 70.0 in | Wt 171.0 lb

## 2022-04-16 DIAGNOSIS — M542 Cervicalgia: Secondary | ICD-10-CM

## 2022-04-16 DIAGNOSIS — M5090 Cervical disc disorder, unspecified, unspecified cervical region: Secondary | ICD-10-CM | POA: Diagnosis not present

## 2022-04-16 DIAGNOSIS — M9901 Segmental and somatic dysfunction of cervical region: Secondary | ICD-10-CM

## 2022-04-16 DIAGNOSIS — M9908 Segmental and somatic dysfunction of rib cage: Secondary | ICD-10-CM

## 2022-04-16 DIAGNOSIS — M9902 Segmental and somatic dysfunction of thoracic region: Secondary | ICD-10-CM | POA: Diagnosis not present

## 2022-04-16 MED ORDER — MELOXICAM 15 MG PO TABS: 15.0000 mg | ORAL_TABLET | Freq: Every day | ORAL | 0 refills | Status: DC

## 2022-04-16 MED ORDER — GABAPENTIN 100 MG PO CAPS: 200.0000 mg | ORAL_CAPSULE | Freq: Every day | ORAL | 0 refills | Status: DC

## 2022-04-16 NOTE — Patient Instructions (Signed)
Meloxicam '15mg'$  during day as needed Gabapentin '200mg'$  at night Tart cherry extract '1200mg'$  at night CoQ10 226mg daily Exercises Use standing desk See me in 4-6 weeks

## 2022-04-16 NOTE — Assessment & Plan Note (Signed)
Patient does have some degenerative disc disease.  Discussed with patient at great length, home exercises, as well as throughout the day.  Meloxicam and gabapentin were given.  I do think that this will be short-term use hopefully.  Patient is not taking any other prescription medications regularly other than the statin at the moment.  Hopefully patient is responding fairly well to conservative therapy.  Attempted osteopathic manipulation as well today.  We will follow-up again in 5 to 6 weeks

## 2022-04-16 NOTE — Assessment & Plan Note (Signed)
   Decision today to treat with OMT was based on Physical Exam  After verbal consent patient was treated with HVLA, ME, FPR techniques in cervical, thoracic, rib,areas, all areas are chronic   Patient tolerated the procedure well with improvement in symptoms  Patient given exercises, stretches and lifestyle modifications  See medications in patient instructions if given  Patient will follow up in 4-8 weeks 

## 2022-04-22 ENCOUNTER — Ambulatory Visit: Payer: 59 | Admitting: Family Medicine

## 2022-05-21 NOTE — Progress Notes (Signed)
  [place] [facility] [place] Phone: [phone] Subjective:   [name], am serving as a scribe for Dr. Hulan Saas.  I'm seeing this patient by the request  of:  Orpah Melter, MD  CC: Back and neck pain follow-up  [id number]  John [name] is a 61 y.o. adult coming in with complaint of back and neck pain. OMT 04/16/2022. Patient states that his neck pain has improved. Still having pain but not to the degree that it was at initial visit. Feels improvement with gabapentin and meloxicam.   Medications patient has been prescribed: Gabapentin, Meloxicam  Taking:         Reviewed prior external information including notes and imaging from previsou exam, outside providers and external EMR if available.   As well as notes that were available from care everywhere and other healthcare systems.  Past medical history, social, surgical and family history all reviewed in electronic medical record.  No pertanent information unless stated regarding to the chief complaint.   Past Medical History:  Diagnosis Date   Arthritis    OA right shoulder   Cancer ([facility])    BCC of right forearm   Hyperlipidemia    Shoulder impingement    right    No Known Allergies   Review of Systems:  No headache, visual changes, nausea, vomiting, diarrhea, constipation, dizziness, abdominal pain, skin rash, fevers, chills, night sweats, weight loss, swollen lymph nodes, body aches, joint swelling, chest pain, shortness of breath, mood changes. POSITIVE muscle aches but improving  Objective  Blood pressure 122/76, pulse (!) 58, height '5\' 10"'$  (1.778 m), weight 174 lb (78.9 kg), SpO2 99 %.   General: No apparent distress alert and oriented x3 mood and affect normal, dressed appropriately.  HEENT: Pupils equal, extraocular movements intact  Respiratory: Patient's speak in full sentences and does not appear short of breath  Cardiovascular: No  lower extremity edema, non tender, no erythema  MSK:  Back continues to have tightness noted in the paraspinal musculature.  Neck exam still has some limited sidebending bilaterally.  Mild crepitus with even rotation of the neck.  Negative Spurling's.  Osteopathic findings  C2 flexed rotated and side bent right C5 flexed rotated and side bent left T5 extended rotated and side bent right inhaled rib L2 flexed rotated and side bent right Sacrum right on right       Assessment and Plan:  No problem-specific Assessment & Plan notes found for this encounter.    Nonallopathic problems  Decision today to treat with OMT was based on Physical Exam  After verbal consent patient was treated with HVLA, ME, FPR techniques in cervical, rib, thoracic, lumbar, and sacral  areas  Patient tolerated the procedure well with improvement in symptoms  Patient given exercises, stretches and lifestyle modifications  See medications in patient instructions if given  Patient will follow up in 4-8 weeks     The above documentation has been reviewed and is accurate and complete John Pulley, DO         Note: This dictation was prepared with Dragon dictation along with smaller phrase technology. Any transcriptional errors that result from this process are unintentional.

## 2022-05-27 ENCOUNTER — Ambulatory Visit (INDEPENDENT_AMBULATORY_CARE_PROVIDER_SITE_OTHER): Payer: 59 | Admitting: Family Medicine

## 2022-05-27 VITALS — BP 122/76 | HR 58 | Ht 70.0 in | Wt 174.0 lb

## 2022-05-27 DIAGNOSIS — M9908 Segmental and somatic dysfunction of rib cage: Secondary | ICD-10-CM | POA: Diagnosis not present

## 2022-05-27 DIAGNOSIS — M9903 Segmental and somatic dysfunction of lumbar region: Secondary | ICD-10-CM | POA: Diagnosis not present

## 2022-05-27 DIAGNOSIS — M9904 Segmental and somatic dysfunction of sacral region: Secondary | ICD-10-CM | POA: Diagnosis not present

## 2022-05-27 DIAGNOSIS — M2550 Pain in unspecified joint: Secondary | ICD-10-CM

## 2022-05-27 DIAGNOSIS — M9901 Segmental and somatic dysfunction of cervical region: Secondary | ICD-10-CM | POA: Diagnosis not present

## 2022-05-27 DIAGNOSIS — M9902 Segmental and somatic dysfunction of thoracic region: Secondary | ICD-10-CM

## 2022-05-27 DIAGNOSIS — M5090 Cervical disc disorder, unspecified, unspecified cervical region: Secondary | ICD-10-CM | POA: Diagnosis not present

## 2022-05-27 LAB — COMPREHENSIVE METABOLIC PANEL
ALT: 32 U/L (ref 0–53)
AST: 29 U/L (ref 0–37)
Albumin: 4.4 g/dL (ref 3.5–5.2)
Alkaline Phosphatase: 45 U/L (ref 39–117)
BUN: 18 mg/dL (ref 6–23)
CO2: 27 mEq/L (ref 19–32)
Calcium: 9.2 mg/dL (ref 8.4–10.5)
Chloride: 102 mEq/L (ref 96–112)
Creatinine, Ser: 1.01 mg/dL (ref 0.40–1.50)
GFR: 79.8 mL/min (ref >60.00)
Glucose, Bld: 118 mg/dL — ABNORMAL HIGH (ref 70–99)
Potassium: 4 mEq/L (ref 3.5–5.1)
Sodium: 136 mEq/L (ref 135–145)
Total Bilirubin: 0.6 mg/dL (ref 0.2–1.2)
Total Protein: 7 g/dL (ref 6.0–8.3)

## 2022-05-27 LAB — URIC ACID: Uric Acid, Serum: 5.8 mg/dL (ref 4.0–7.8)

## 2022-05-27 NOTE — Patient Instructions (Addendum)
Good to see you Have fun on the cruise Labs on the way out  Can take the Meloxicam five days in a row with a two day break and again as needed  Continue gabapentin Continue exercises See me again in -6-8 weeks

## 2022-05-27 NOTE — Assessment & Plan Note (Signed)
Patient likely has made some improvement overall.  Patient did have some mild decrease in GFR when patient was hospitalized previously and I think at this point I would like to recheck to make sure patient's kidneys is tolerating the anti-inflammatory.  Did discuss taking it in 5-day burst of the meloxicam 15 mg as needed.  In addition to this we discussed home exercises, icing regimen, which activities to do which ones to avoid.  We discussed posture and ergonomics throughout the day.  Follow-up with me again in 6 to 8 weeks.

## 2022-06-01 LAB — VITAMIN D 1,25 DIHYDROXY
Vitamin D 1, 25 (OH)2 Total: 32 pg/mL (ref 18–72)
Vitamin D2 1, 25 (OH)2: <8 pg/mL
Vitamin D3 1, 25 (OH)2: 32 pg/mL

## 2022-06-25 ENCOUNTER — Other Ambulatory Visit: Payer: Self-pay | Admitting: Family Medicine

## 2022-06-26 MED ORDER — MELOXICAM 15 MG PO TABS: 15.0000 mg | ORAL_TABLET | Freq: Every day | ORAL | 0 refills | Status: DC

## 2022-06-30 ENCOUNTER — Other Ambulatory Visit: Payer: Self-pay | Admitting: *Deleted

## 2022-06-30 MED ORDER — MELOXICAM 15 MG PO TABS: 15.0000 mg | ORAL_TABLET | Freq: Every day | ORAL | 0 refills | Status: DC

## 2022-07-08 ENCOUNTER — Ambulatory Visit: Payer: 59 | Admitting: Family Medicine

## 2022-07-29 NOTE — Progress Notes (Signed)
[place] Phone: [phone] Subjective:   John [name], am serving as a scribe for Dr. Hulan Saas.  I'm seeing this patient by the request  of:  Orpah Melter, MD  CC: Neck and back pain  [id number]  John [name] is a 62 y.o. adult coming in with complaint of back and neck pain. OMT 05/27/2022. Patient states that he is feeling the same as last visit. Pain mostly in C spine.  Still has tightness noted.  Sometimes uncomfortable at night still.  Denies any radiation of the pain at the moment.  Only taking 1 pill of the gabapentin at night  Medications patient has been prescribed: Gabapentin, Meloxicam  Taking: Intermittently         Reviewed prior external information including notes and imaging from previsou exam, outside providers and external EMR if available.   As well as notes that were available from care everywhere and other healthcare systems.  Past medical history, social, surgical and family history all reviewed in electronic medical record.  [name] pertanent information unless stated regarding to the chief complaint.   Past Medical History:  Diagnosis Date   Arthritis    OA right shoulder   Cancer ([facility])    BCC of right forearm   Hyperlipidemia    Shoulder impingement    right    [name] Known Allergies   Review of Systems:  [name] headache, visual changes, nausea, vomiting, diarrhea, constipation, dizziness, abdominal pain, skin rash, fevers, chills, night sweats, weight loss, swollen lymph nodes, body aches, joint swelling, chest pain, shortness of breath, mood changes. POSITIVE muscle aches  Objective  Blood pressure 114/72, pulse 73, height '5\' 10"'$  (1.778 m), weight 176 lb (79.8 kg), SpO2 98 %.   General: [name] apparent distress alert and oriented x3 mood and affect normal, dressed appropriately.  HEENT: Pupils equal, extraocular movements intact  Respiratory: Patient's speak in full  sentences and does not appear short of breath  Cardiovascular: [name] lower extremity edema, non tender, [name] erythema  Gait MSK:  Back   Osteopathic findings  C2 flexed rotated and side bent right C6 flexed rotated and side bent right T5 extended rotated and side bent right inhaled rib T9 extended rotated and side bent left        Assessment and Plan:  Cervical neck pain with evidence of disc disease Known degenerative disc disease.  Patient has been currently noncompliant with some of the exercises.  Still posture should be as well.  Discussed the importance of strengthening aspect.  Patient does respond well to osteopathic manipulation.  Follow-up again in 6 to 8 weeks.  Continue gabapentin and increasing increase to 300 mg at night would be fine.     Nonallopathic problems  Decision today to treat with OMT was based on Physical Exam  After verbal consent patient was treated with HVLA, ME, FPR techniques in cervical, rib, thoracic  areas  Patient tolerated the procedure well with improvement in symptoms  Patient given exercises, stretches and lifestyle modifications  See medications in patient instructions if given  Patient will follow up in 4-8 weeks     The above documentation has been reviewed and is accurate and complete John Pulley, DO         Note: This dictation was prepared with Dragon dictation along with smaller phrase technology. Any transcriptional errors that result from this process are unintentional.

## 2022-08-01 ENCOUNTER — Encounter: Payer: Self-pay | Admitting: Family Medicine

## 2022-08-01 ENCOUNTER — Ambulatory Visit (INDEPENDENT_AMBULATORY_CARE_PROVIDER_SITE_OTHER): Payer: 59 | Admitting: Family Medicine

## 2022-08-01 VITALS — BP 114/72 | HR 73 | Ht 70.0 in | Wt 176.0 lb

## 2022-08-01 DIAGNOSIS — M9908 Segmental and somatic dysfunction of rib cage: Secondary | ICD-10-CM

## 2022-08-01 DIAGNOSIS — M5090 Cervical disc disorder, unspecified, unspecified cervical region: Secondary | ICD-10-CM

## 2022-08-01 DIAGNOSIS — M9901 Segmental and somatic dysfunction of cervical region: Secondary | ICD-10-CM

## 2022-08-01 DIAGNOSIS — M9902 Segmental and somatic dysfunction of thoracic region: Secondary | ICD-10-CM | POA: Diagnosis not present

## 2022-08-01 NOTE — Assessment & Plan Note (Signed)
Known degenerative disc disease.  Patient has been currently noncompliant with some of the exercises.  Still posture should be as well.  Discussed the importance of strengthening aspect.  Patient does respond well to osteopathic manipulation.  Follow-up again in 6 to 8 weeks.  Continue gabapentin and increasing increase to 300 mg at night would be fine.

## 2022-08-01 NOTE — Patient Instructions (Signed)
Good to see you  Little more band work Try the yoga wheel  Follow up in 7-8 weeks

## 2022-09-23 ENCOUNTER — Ambulatory Visit: Payer: 59 | Admitting: Family Medicine

## 2022-11-13 NOTE — Progress Notes (Signed)
[facility] [address] [place] [address] Phone: [phone] Subjective:   John [name], am serving as a scribe for Dr. Antoine Primas.  I'm seeing this patient by the request  of:  Joycelyn Rua, MD  CC: Neck pain follow-up  [id number]  John [name] is a 62 y.o. adult coming in with complaint of back and neck pain. OMT 08/01/2022. Patient states that he is the same as last visit. Pain not as bad as it was during initial visit. Does have pain daily.  Has not been taking the medicine daily and is wondering if that would be more helpful.  Medications patient has been prescribed: Meloxicam  Taking:         Reviewed prior external information including notes and imaging from previsou exam, outside providers and external EMR if available.   As well as notes that were available from care everywhere and other healthcare systems.  Past medical history, social, surgical and family history all reviewed in electronic medical record.  No pertanent information unless stated regarding to the chief complaint.   Past Medical History:  Diagnosis Date   Arthritis    OA right shoulder   Cancer ([facility])    BCC of right forearm   Hyperlipidemia    Shoulder impingement    right    No Known Allergies   Review of Systems:  No headache, visual changes, nausea, vomiting, diarrhea, constipation, dizziness, abdominal pain, skin rash, fevers, chills, night sweats, weight loss, swollen lymph nodes, body aches, joint swelling, chest pain, shortness of breath, mood changes. POSITIVE muscle aches  Objective  Blood pressure 110/70, pulse 61, height 5\' 10"  (1.778 m), weight 176 lb (79.8 kg), SpO2 97 %.   General: No apparent distress alert and oriented x3 mood and affect normal, dressed appropriately.  HEENT: Pupils equal, extraocular movements intact  Respiratory: Patient's speak in full sentences and does not appear short of breath  Cardiovascular: No  lower extremity edema, non tender, no erythema  Neck exam does have some mild loss of lordosis noted.  Some tenderness to palpation in the paraspinal musculature.  Patient does have some limited sidebending bilaterally.  No true radicular symptoms so likely.  Osteopathic findings  C2 flexed rotated and side bent right C4 flexed rotated and side bent right C7 flexed rotated and side bent left T3 extended rotated and side bent right inhaled rib T5 extended rotated and side bent left        Assessment and Plan:  Cervical neck pain with evidence of disc disease Arthritic changes.  Exacerbation noted.  Discussed icing regimen and home exercises, discussed which activities to do and which ones to avoid.  Increase activity slowly.  Follow-up with me again in 6 to 8 weeks otherwise.  Discussed which activities to do and which ones to avoid.  Will follow-up again 6 to 8 weeks Refilled medications including meloxicam and gabapentin and told to use on a more regular basis.  Nonallopathic problems  Decision today to treat with OMT was based on Physical Exam  After verbal consent patient was treated with HVLA, ME, FPR techniques in cervical, rib, thoracic,  areas  Patient tolerated the procedure well with improvement in symptoms  Patient given exercises, stretches and lifestyle modifications  See medications in patient instructions if given  Patient will follow up in 4-8 weeks     The above documentation has been reviewed and is accurate and complete Judi Saa, DO  Note: This dictation was prepared with Dragon dictation along with smaller phrase technology. Any transcriptional errors that result from this process are unintentional.

## 2022-11-18 ENCOUNTER — Ambulatory Visit (INDEPENDENT_AMBULATORY_CARE_PROVIDER_SITE_OTHER): Payer: 59 | Admitting: Family Medicine

## 2022-11-18 VITALS — BP 110/70 | HR 61 | Ht 70.0 in | Wt 176.0 lb

## 2022-11-18 DIAGNOSIS — M9902 Segmental and somatic dysfunction of thoracic region: Secondary | ICD-10-CM | POA: Diagnosis not present

## 2022-11-18 DIAGNOSIS — M5090 Cervical disc disorder, unspecified, unspecified cervical region: Secondary | ICD-10-CM | POA: Diagnosis not present

## 2022-11-18 DIAGNOSIS — M9901 Segmental and somatic dysfunction of cervical region: Secondary | ICD-10-CM | POA: Diagnosis not present

## 2022-11-18 DIAGNOSIS — M9908 Segmental and somatic dysfunction of rib cage: Secondary | ICD-10-CM | POA: Diagnosis not present

## 2022-11-18 MED ORDER — MELOXICAM 15 MG PO TABS: 15.0000 mg | ORAL_TABLET | Freq: Every day | ORAL | 0 refills | Status: AC

## 2022-11-18 MED ORDER — GABAPENTIN 100 MG PO CAPS: 200.0000 mg | ORAL_CAPSULE | Freq: Every day | ORAL | 0 refills | Status: AC

## 2022-11-18 NOTE — Assessment & Plan Note (Addendum)
Arthritic changes.  Exacerbation noted.  Discussed icing regimen and home exercises, discussed which activities to do and which ones to avoid.  Increase activity slowly.  Follow-up with me again in 6 to 8 weeks otherwise.  Discussed which activities to do and which ones to avoid.  Will follow-up again 6 to 8 weeks

## 2022-11-18 NOTE — Patient Instructions (Signed)
Refill Meloxicam and Gabapentin See me again in 2-3 months

## 2022-12-14 IMAGING — US US ABDOMEN LIMITED
1 series · 14 of 25 positions shown · non-contrast
Comparison: CT abdomen and pelvis 07/12/2006.

CLINICAL DATA: Right upper quadrant pain.

EXAM:
ULTRASOUND ABDOMEN LIMITED RIGHT UPPER QUADRANT

[Series 1: us abdomen limited · 57 acquisitions, 14 frames shown]
[im 1/57]
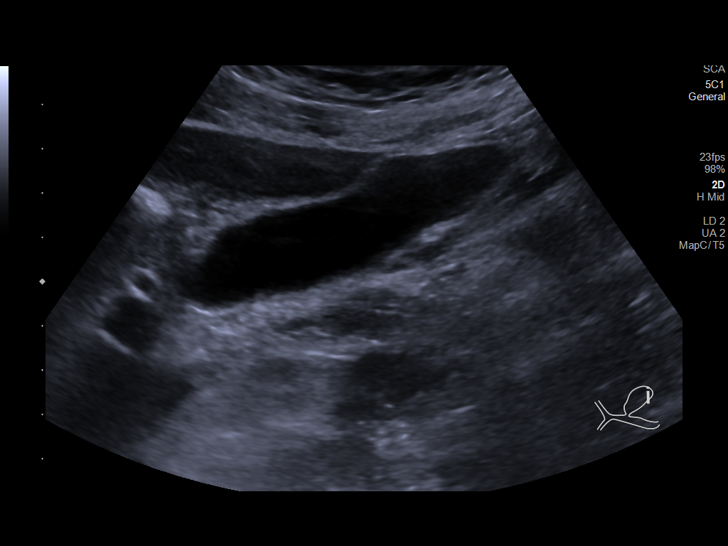
[im 5/57]
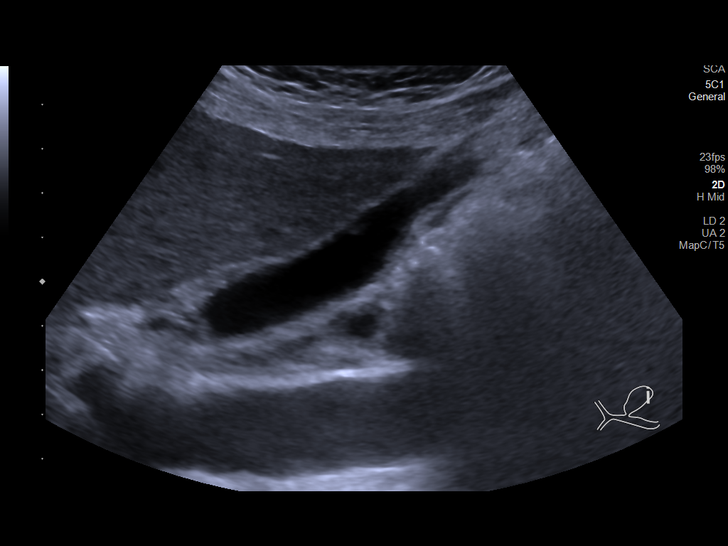
[im 10/57]
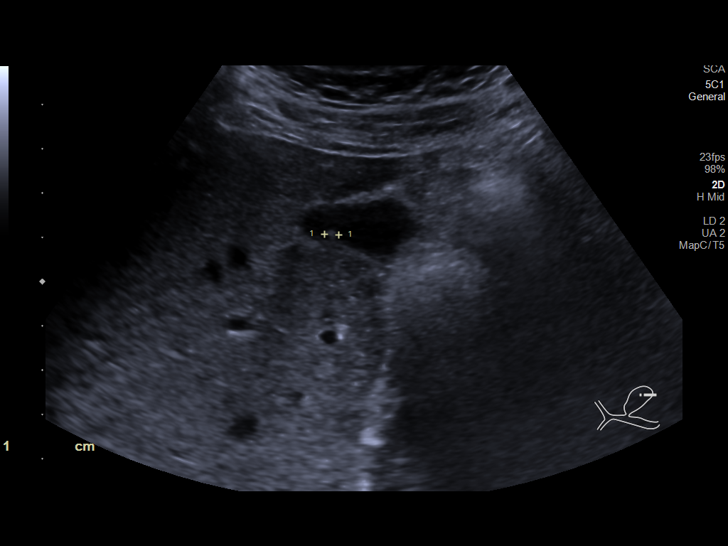
[im 15/57]
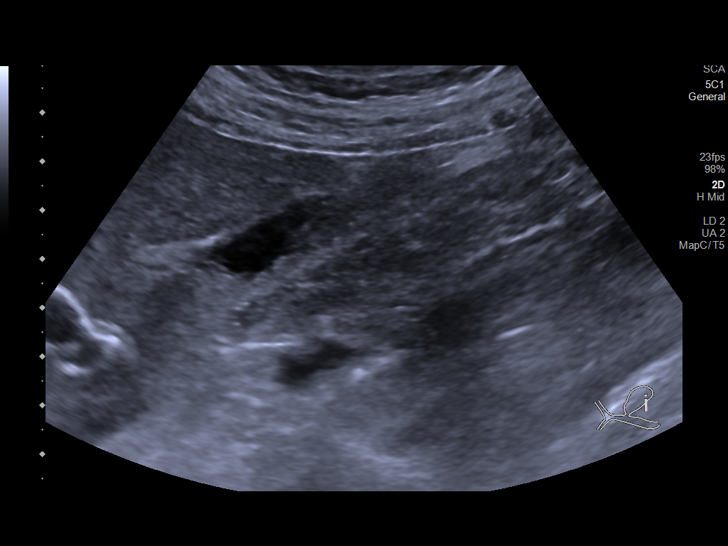
[im 19/57]
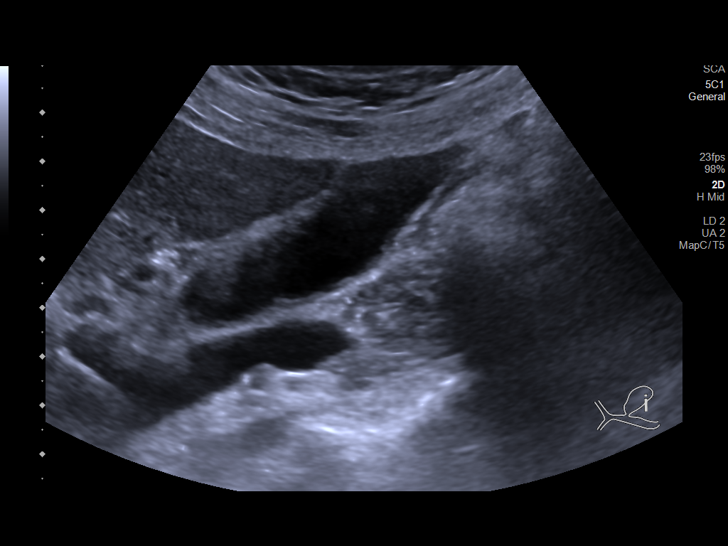
[im 22/57]
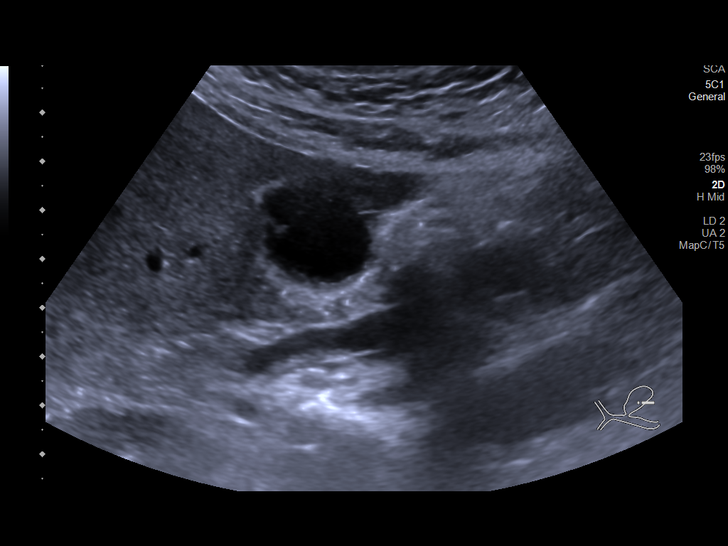
[im 26/57]
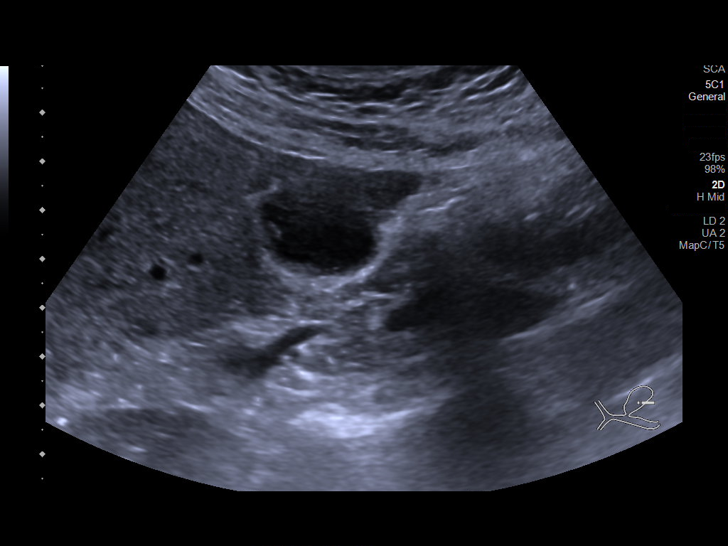
[im 31/57]
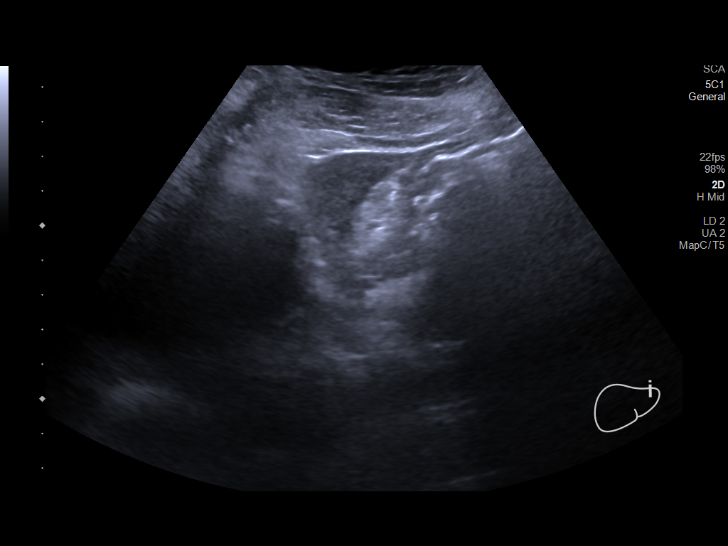
[im 36/57]
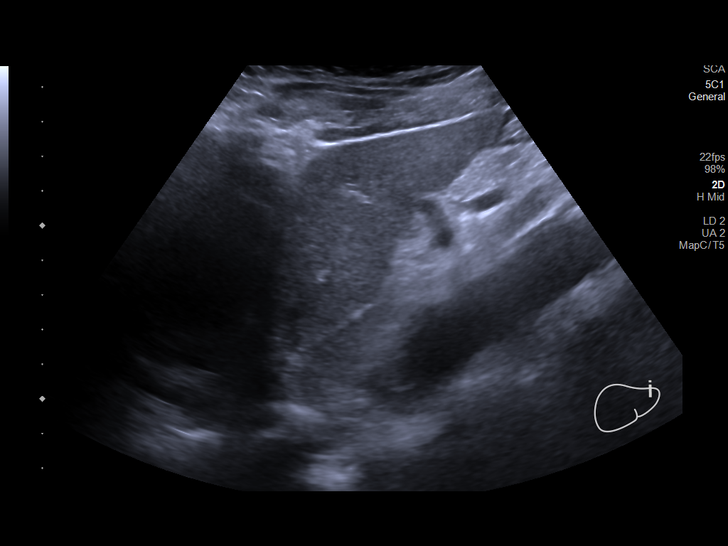
[im 38/57]
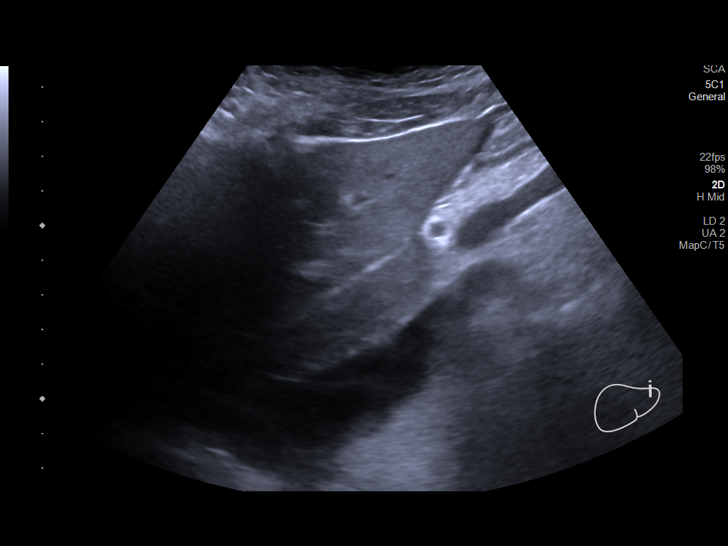
[im 43/57]
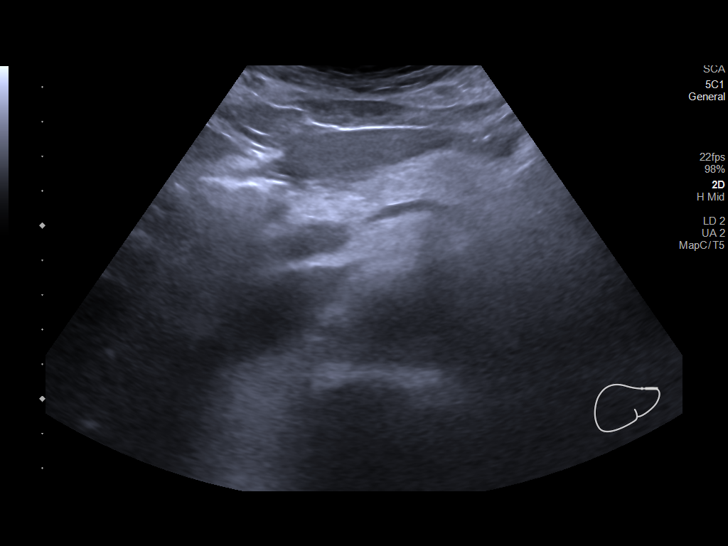
[im 47/57]
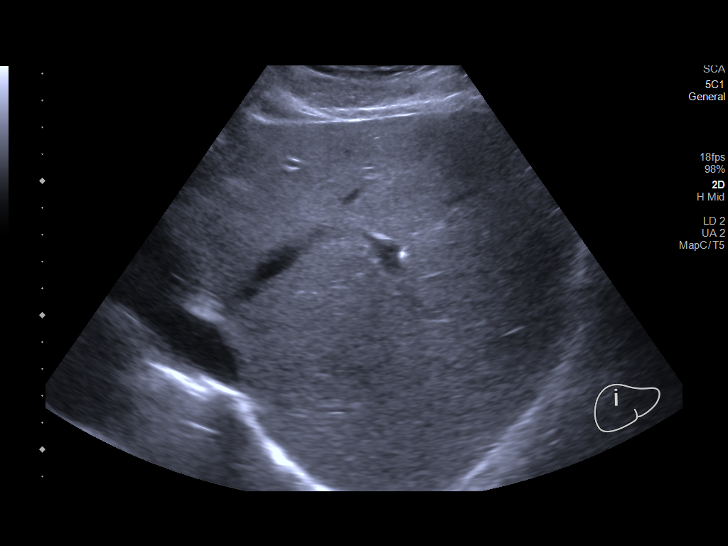
[im 52/57]
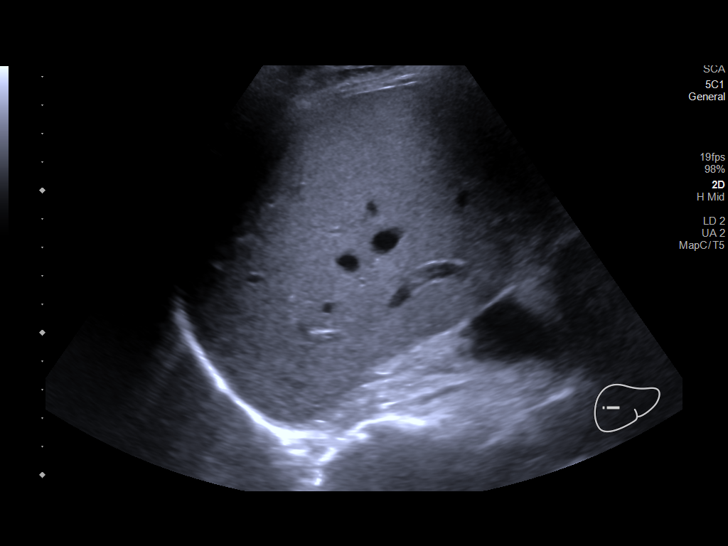
[im 57/57]
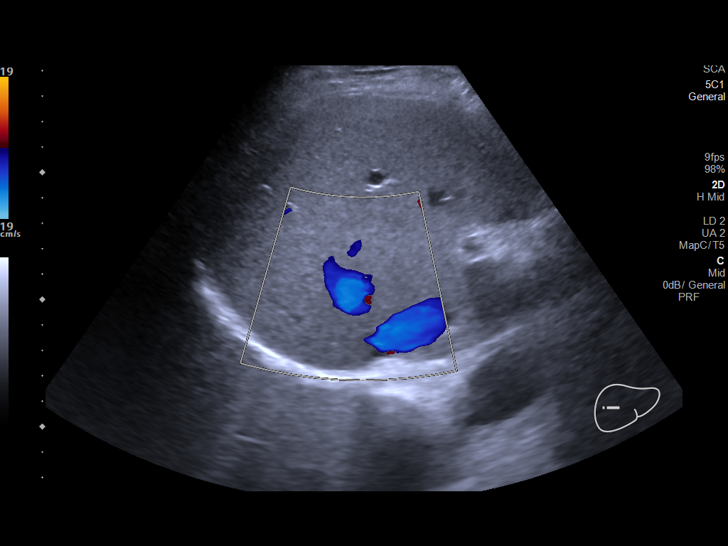

[14 of 25 positions shown; findings below may reference images not displayed]

FINDINGS: Gallbladder:

No gallstones or wall thickening visualized. No sonographic Murphy
sign noted by sonographer. Two gallbladder polyps are identified
measuring up to 3.8 mm.

Common bile duct:

Diameter: 1.6 mm.

Liver:

No focal lesion identified. Within normal limits in parenchymal
echogenicity. Portal vein is patent on color Doppler imaging with
normal direction of blood flow towards the liver.

Other: None.
IMPRESSION: 1. No evidence for cholelithiasis or acute cholecystitis.
2. Gallbladder polyps measuring up to 3.8 mm. These are felt to be
low risk.; However, the patient is symptomatic. Consider surgical
consultation follow-up and/or follow-up ultrasound in 1 year.

## 2022-12-14 IMAGING — CT CT CARDIAC CORONARY ARTERY CALCIUM SCORE
2 series · 15 of 20 positions shown, 17 images · non-contrast
Comparison: None.
COMPARISON: None.

Addendum:
EXAM:
OVER-READ INTERPRETATION  CT CHEST

The following report is an over-read performed by radiologist Dr.
Lorenz Jumper [REDACTED] on 05/14/2021. This
over-read does not include interpretation of cardiac or coronary
anatomy or pathology. The coronary calcium score interpretation by
the cardiologist is attached.
CLINICAL DATA: 60M for cardiovascular disease risk stratification
Coronary Calcium Score
TECHNIQUE: A gated, non-contrast computed tomography scan of the heart was
performed using 3mm slice thickness. Axial images were analyzed on a
dedicated workstation. Calcium scoring of the coronary arteries was
performed using the Agatston method.

[Series 2: cascseq 3.0 b35f 70% · axial · 0.39mm/px · z∈[-267,-168]mm · 7 of 51 slices shown]
[im 6/51  vessel]
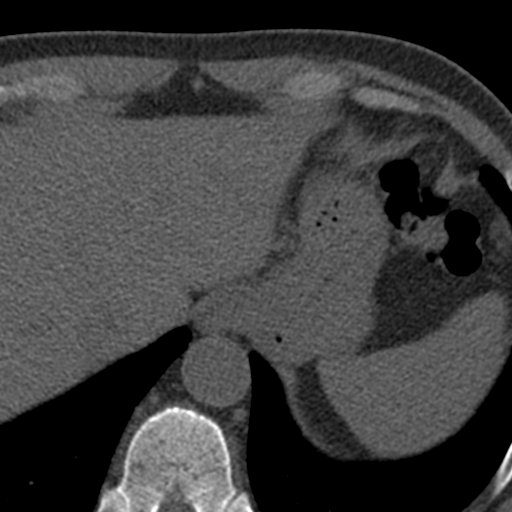
[im 12/51  vessel]
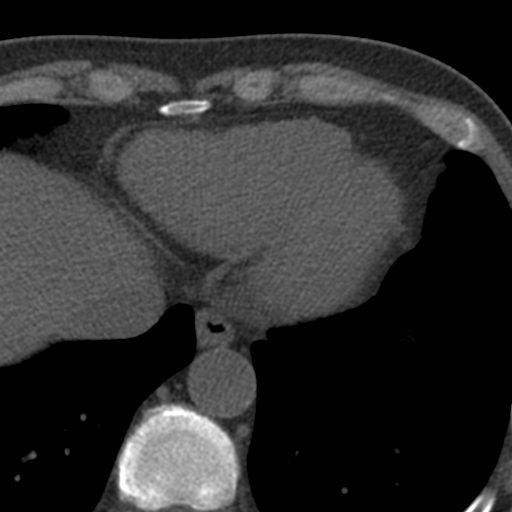
[im 17/51  vessel]
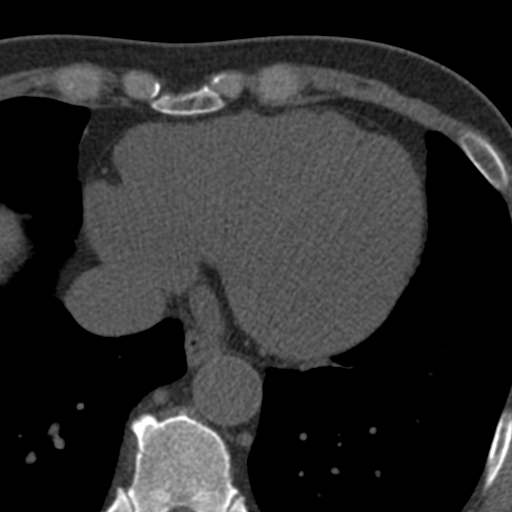
[im 23/51  vessel]
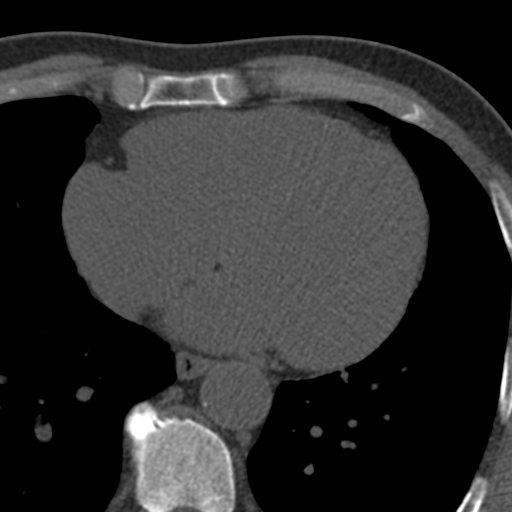
[im 28/51  vessel]
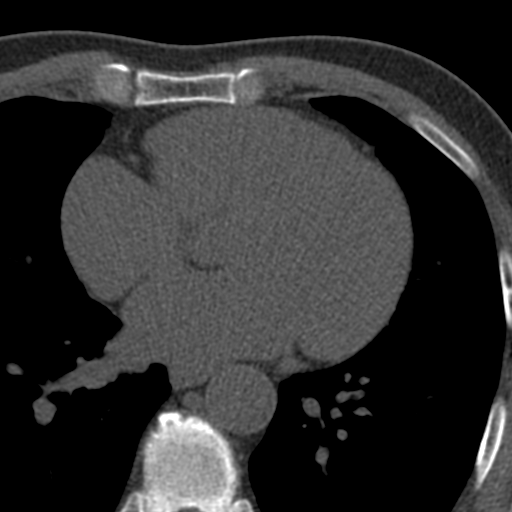
[im 34/51  vessel]
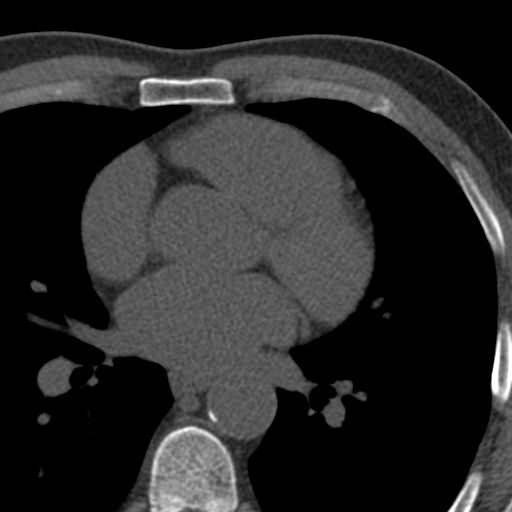
[im 39/51  vessel]
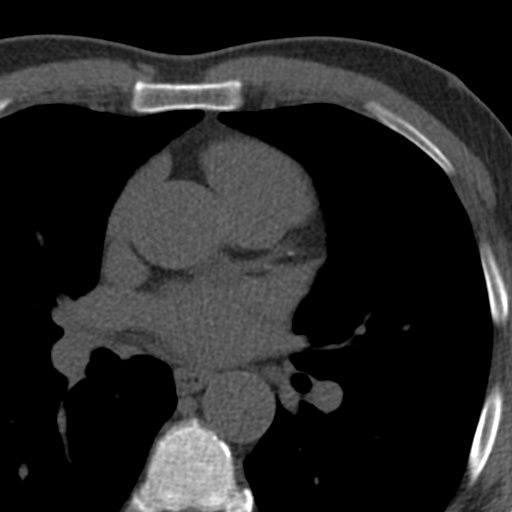

[Series 3: full fov st · axial · 0.64mm/px · z∈[-267,-150]mm · 8 of 51 slices shown, 10 images]
[im 6/51  vessel]
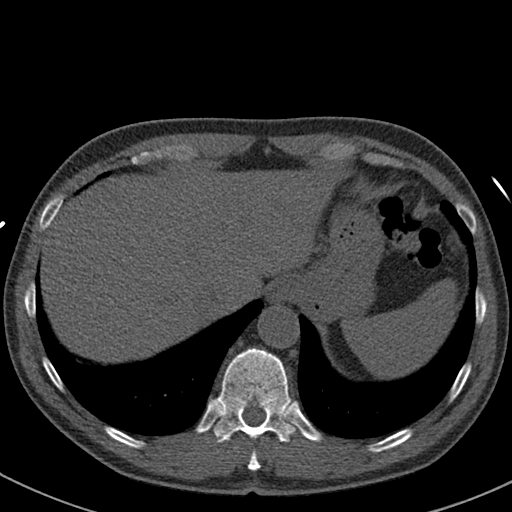
[im 6/51  lung]
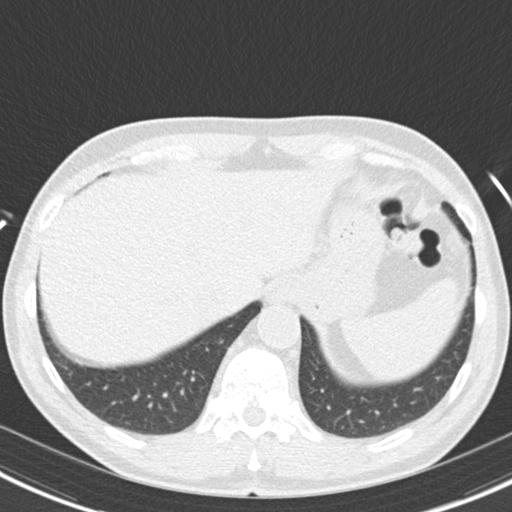
[im 12/51  vessel]
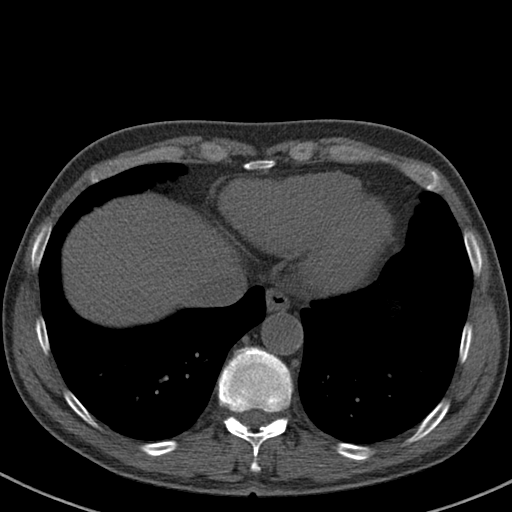
[im 17/51  vessel]
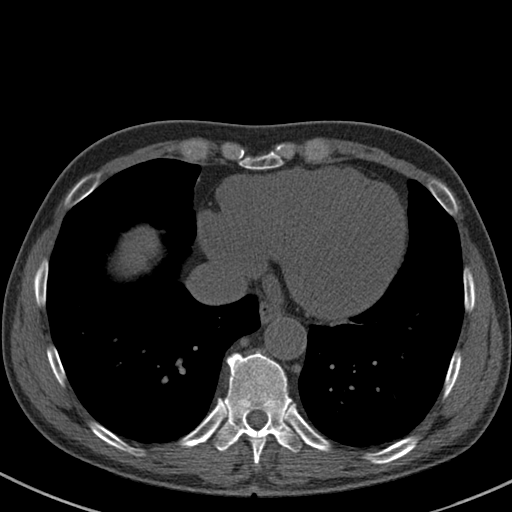
[im 23/51  vessel]
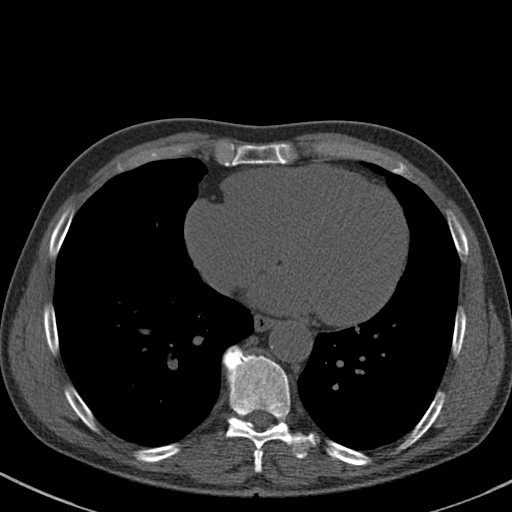
[im 28/51  vessel]
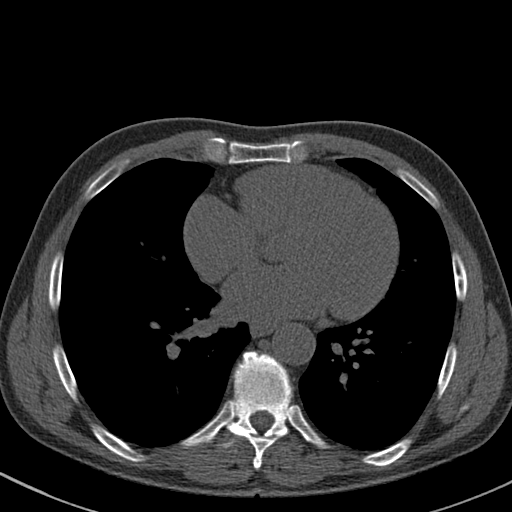
[im 28/51  lung]
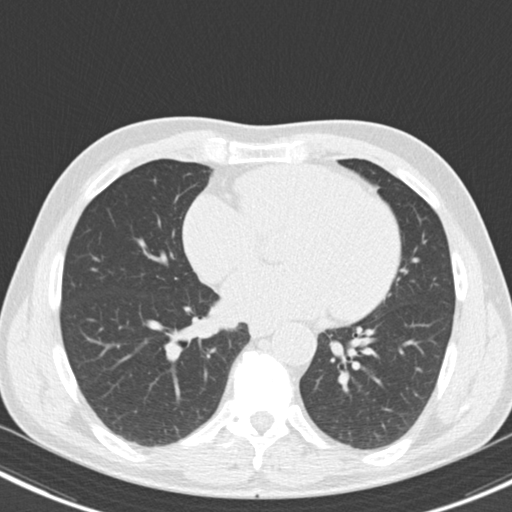
[im 34/51  vessel]
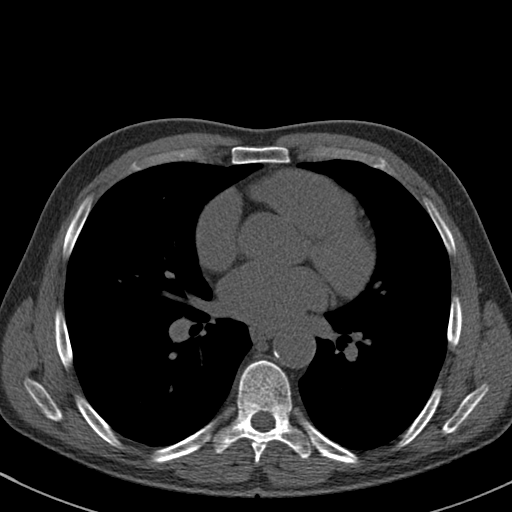
[im 39/51  vessel]
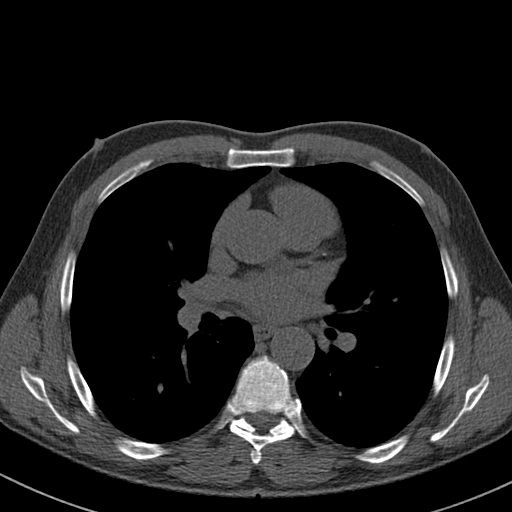
[im 45/51  vessel]
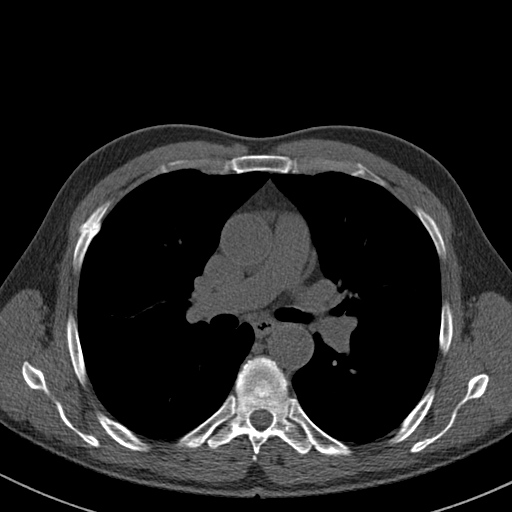

[15 of 20 positions shown; findings below may reference images not displayed]

FINDINGS: Vascular: Heart is normal size. Scattered calcifications in the
aortic root and descending thoracic aorta. No aneurysm.

Mediastinum/Nodes: No adenopathy

Lungs/Pleura: No confluent opacities or effusions.

Upper Abdomen: Imaging into the upper abdomen demonstrates no acute
findings.

Musculoskeletal: Chest wall soft tissues are unremarkable. No acute
bony abnormality.
IMPRESSION: No acute extra cardiac abnormality.

Scattered aortic atherosclerosis.
FINDINGS: Coronary arteries: Normal origins.

Coronary Calcium Score:

Left main: 0

Left anterior descending artery:

Left circumflex artery:

Right coronary artery: 0

Total:

Percentile: 47th

Pericardium: Normal.

Ascending Aorta: Normal caliber. Calcification of the aortic root
and descending aorta.

Non-cardiac: See separate report from [REDACTED].
IMPRESSION: Coronary calcium score of 27.2. This was 47th percentile for age-,
race-, and sex-matched controls.

Calcification of the aortic root and descending aorta.



If CAC=0, it is reasonable to withhold statin therapy and reassess
in 5 to 10 years, as long as higher risk conditions are absent
(diabetes mellitus, family history of premature CHD in first degree
relatives (males <55 years; females <65 years), cigarette smoking,
or LDL >=190 mg/dL).

If CAC is 1 to 99, it is reasonable to initiate statin therapy for
patients >=55 years of age.

If CAC is >=100 or >=75th percentile, it is reasonable to initiate
statin therapy at any age.

Cardiology referral should be considered for patients with CAC
scores >=400 or >=75th percentile.

*0009 AHA/ACC/AACVPR/AAPA/ABC/PALWINDER/JANELLE/TIGER/Emoxita/TODARO/MAN[place] RICKETTS/ESTHET
Guideline on the Management of Blood Cholesterol: A Report of the
American College of Cardiology/American Heart Association Task Force
on Clinical Practice Guidelines. J Am Coll Cardiol.
0098;73(24):1391-1450.

*** End of Addendum ***
EXAM:
OVER-READ INTERPRETATION  CT CHEST

The following report is an over-read performed by radiologist Dr.
Lorenz Jumper [REDACTED] on 05/14/2021. This
over-read does not include interpretation of cardiac or coronary
anatomy or pathology. The coronary calcium score interpretation by
the cardiologist is attached.
FINDINGS: Vascular: Heart is normal size. Scattered calcifications in the
aortic root and descending thoracic aorta. No aneurysm.

Mediastinum/Nodes: No adenopathy

Lungs/Pleura: No confluent opacities or effusions.

Upper Abdomen: Imaging into the upper abdomen demonstrates no acute
findings.

Musculoskeletal: Chest wall soft tissues are unremarkable. No acute
bony abnormality.
IMPRESSION: No acute extra cardiac abnormality.

Scattered aortic atherosclerosis.

## 2023-02-17 ENCOUNTER — Other Ambulatory Visit: Payer: Self-pay | Admitting: Adult Health

## 2023-02-17 ENCOUNTER — Ambulatory Visit: Payer: 59 | Admitting: Family Medicine

## 2023-04-06 ENCOUNTER — Telehealth: Payer: Self-pay | Admitting: Adult Health

## 2023-04-06 NOTE — Telephone Encounter (Signed)
The patient was released back to his primary care when he was last seen February 2023 given his stable condition.  Unless he has new or worsening symptoms, he should be getting refills from his primary care.  Please discuss this with him and advise him to contact them.  He likely was given some courtesy refills until he could get them from his regular doctor.

## 2023-04-06 NOTE — Telephone Encounter (Signed)
Ok thanks

## 2023-04-06 NOTE — Telephone Encounter (Signed)
Called pt and informed him of this. Pt appreciated the call and stated that he will call his PCP for the refill.

## 2023-04-06 NOTE — Telephone Encounter (Signed)
Pt would like a call to discuss why he was previously able to get the propranolol (INDERAL) 20 MG tablet, as a 90 day and now only as a 30 day with no refills.  Pt confirmed there has been no change to pharmacy or insurance.
